# Patient Record
Sex: Female | Born: 1940 | Race: White | Hispanic: No | Smoking: Never smoker
Health system: Southern US, Community
[De-identification: ages and names within clinical notes are randomized; demographics above are authoritative.]

## PROBLEM LIST (undated history)

## (undated) DIAGNOSIS — F419 Anxiety disorder, unspecified: Secondary | ICD-10-CM

## (undated) DIAGNOSIS — M1990 Unspecified osteoarthritis, unspecified site: Secondary | ICD-10-CM

## (undated) DIAGNOSIS — I10 Essential (primary) hypertension: Secondary | ICD-10-CM

## (undated) DIAGNOSIS — K5792 Diverticulitis of intestine, part unspecified, without perforation or abscess without bleeding: Secondary | ICD-10-CM

## (undated) DIAGNOSIS — R519 Headache, unspecified: Secondary | ICD-10-CM

## (undated) DIAGNOSIS — R51 Headache: Secondary | ICD-10-CM

## (undated) HISTORY — PX: BUNIONECTOMY: SHX129

## (undated) HISTORY — PX: ABDOMINAL HYSTERECTOMY: SHX81

## (undated) HISTORY — DX: Diverticulitis of intestine, part unspecified, without perforation or abscess without bleeding: K57.92

## (undated) HISTORY — PX: HERNIA REPAIR: SHX51

## (undated) HISTORY — PX: OTHER SURGICAL HISTORY: SHX169

## (undated) HISTORY — PX: CARPAL TUNNEL RELEASE: SHX101

## (undated) HISTORY — PX: TONSILLECTOMY: SUR1361

---

## 1999-04-17 ENCOUNTER — Ambulatory Visit (HOSPITAL_COMMUNITY): Admission: RE | Admit: 1999-04-17 | Discharge: 1999-04-17 | Payer: Self-pay | Admitting: Family Medicine

## 1999-04-17 ENCOUNTER — Encounter: Payer: Self-pay | Admitting: Family Medicine

## 2000-06-27 ENCOUNTER — Encounter: Payer: Self-pay | Admitting: Obstetrics and Gynecology

## 2000-06-27 ENCOUNTER — Encounter: Admission: RE | Admit: 2000-06-27 | Discharge: 2000-06-27 | Payer: Self-pay | Admitting: Obstetrics and Gynecology

## 2000-06-29 ENCOUNTER — Other Ambulatory Visit: Admission: RE | Admit: 2000-06-29 | Discharge: 2000-06-29 | Payer: Self-pay | Admitting: Obstetrics and Gynecology

## 2000-11-07 ENCOUNTER — Encounter: Payer: Self-pay | Admitting: Emergency Medicine

## 2000-11-07 ENCOUNTER — Emergency Department (HOSPITAL_COMMUNITY): Admission: EM | Admit: 2000-11-07 | Discharge: 2000-11-07 | Payer: Self-pay | Admitting: Emergency Medicine

## 2000-11-09 ENCOUNTER — Ambulatory Visit (HOSPITAL_COMMUNITY): Admission: AD | Admit: 2000-11-09 | Discharge: 2000-11-09 | Payer: Self-pay | Admitting: Urology

## 2000-11-09 ENCOUNTER — Encounter: Payer: Self-pay | Admitting: Urology

## 2002-03-14 ENCOUNTER — Other Ambulatory Visit: Admission: RE | Admit: 2002-03-14 | Discharge: 2002-03-14 | Payer: Self-pay | Admitting: Gynecology

## 2002-06-21 ENCOUNTER — Encounter: Admission: RE | Admit: 2002-06-21 | Discharge: 2002-06-21 | Payer: Self-pay | Admitting: Gynecology

## 2002-06-21 ENCOUNTER — Encounter: Payer: Self-pay | Admitting: Gynecology

## 2003-01-02 ENCOUNTER — Encounter: Admission: RE | Admit: 2003-01-02 | Discharge: 2003-01-02 | Payer: Self-pay | Admitting: Family Medicine

## 2003-01-02 ENCOUNTER — Encounter: Payer: Self-pay | Admitting: Family Medicine

## 2003-12-20 ENCOUNTER — Ambulatory Visit (HOSPITAL_COMMUNITY): Admission: RE | Admit: 2003-12-20 | Discharge: 2003-12-20 | Payer: Self-pay | Admitting: Orthopedic Surgery

## 2003-12-20 ENCOUNTER — Ambulatory Visit (HOSPITAL_BASED_OUTPATIENT_CLINIC_OR_DEPARTMENT_OTHER): Admission: RE | Admit: 2003-12-20 | Discharge: 2003-12-20 | Payer: Self-pay | Admitting: Orthopedic Surgery

## 2012-01-02 ENCOUNTER — Other Ambulatory Visit: Payer: Self-pay | Admitting: Family Medicine

## 2012-01-03 NOTE — Telephone Encounter (Signed)
Rx sent in, [facility] notifying patient.

## 2012-01-03 NOTE — Telephone Encounter (Signed)
PT WANT TO MAKE SURE DR KURT WILL GIVE HER AT LEAST ONE MORE TIME BEFORE SHE COMES BACK IN TO SEE HIM THE MEDICINE IS IMITREX.PLEASE CALL PT AT [phone]     [facility] [address]

## 2012-01-30 ENCOUNTER — Other Ambulatory Visit: Payer: Self-pay | Admitting: Internal Medicine

## 2012-04-03 ENCOUNTER — Ambulatory Visit: Payer: Medicare Other

## 2012-04-04 ENCOUNTER — Ambulatory Visit (INDEPENDENT_AMBULATORY_CARE_PROVIDER_SITE_OTHER): Payer: Medicare Other | Admitting: Family Medicine

## 2012-04-04 VITALS — BP 130/85 | HR 87 | Temp 97.8°F | Resp 16 | Ht 63.5 in | Wt 174.0 lb

## 2012-04-04 DIAGNOSIS — M629 Disorder of muscle, unspecified: Secondary | ICD-10-CM

## 2012-04-04 DIAGNOSIS — G43909 Migraine, unspecified, not intractable, without status migrainosus: Secondary | ICD-10-CM

## 2012-04-04 DIAGNOSIS — M7630 Iliotibial band syndrome, unspecified leg: Secondary | ICD-10-CM

## 2012-04-04 MED ORDER — DICLOFENAC SODIUM 75 MG PO TBEC: DELAYED_RELEASE_TABLET | ORAL | Status: DC

## 2012-04-04 MED ORDER — SUMATRIPTAN SUCCINATE 6 MG/0.5ML ~~LOC~~ SOLN: SUBCUTANEOUS | Status: DC

## 2012-04-04 MED ORDER — SUMATRIPTAN SUCCINATE 100 MG PO TABS: 100.0000 mg | ORAL_TABLET | ORAL | Status: DC | PRN

## 2012-04-04 NOTE — Progress Notes (Signed)
71 yo woman with migraine. She needs a refill of the injectable and the tablets because she travels.  The last generic imitrex injection, given into the thigh, was very painful.  Gets migraines usually when she awakens, several times a month  She also has a problem with her thigh.  Her right lateral thigh has been painful for months, beginning near lateral hip.  No new exercise or trauma.  The hip is better but the lateral thigh persists with tenderness to percussion over lateral knee joint line.    Objective:  NAD Alert, appropriate with normal cranial nerves and motor function. Full hip ROM Mildly tender lateral right thigh and right lateral knee joint line.  Assessment: migraine Iliotibial band syndrome  Plan: 1. Iliotibial band syndrome  diclofenac (VOLTAREN) 75 MG EC tablet  2. Migraine  SUMAtriptan (IMITREX) 100 MG tablet, SUMAtriptan (IMITREX STATDOSE REFILL) 6 MG/0.5ML SOLN injection   Note:  I spent 30 minutes face to face with patient

## 2012-04-04 NOTE — Patient Instructions (Signed)
Iliotibial Band Syndrome Iliotibial band syndrome is pain in the outer, upper thigh. The pain is due to an inflammation (soreness) of the iliotibial band. This is a band of thick fibrous tissue that runs down the outside of the thigh. The iliotibial band begins at the hip. It extends to the outer side of the shin bone (tibia) just below the knee joint. The band works with the thigh muscles. Together they provide stability to the outside of the knee joint. Iliotibial band syndrome (ITBS) occurs when there is inflammation to this band of tissue. The irritation usually occurs over the outside of the knee joint, at the lateral epicondyle (the end of the femur bone.) The iliotibial band crosses bone and muscle at this point. Between these structures is a bursa (a cushioning sac). The bursa should make possible a smooth gliding motion. However, when inflamed, the iliotibial band does not glide easily. When inflamed, there is pain with motion of the knee. Usually the pain worsens with continued movement and the pain goes away with rest. This problem usually arises when there is a sudden increase in sports activities involving the lower extremities (your legs). Running, and playing soccer or basketball are examples of activities causing this. Others who are prone to ITBS include individuals with mechanical problems such as leg length differences, abnormality of walking, bowed legs etc. This diagnosis (learning what is wrong) is made by examination. X-rays are usually normal if only soft tissue inflammation is present. Treatment of ITBS begins with proper footwear, icing the area of pain, stretching, and resting for a period of time. Incorporating low-impact cross-training activities may also help. Your caregiver may prescribe anti-inflammatory medications as well. HOME CARE INSTRUCTIONS   Apply ice to the sore area for 15 to 20 minutes, 3 to 4 times per day. Put the ice in a plastic bag and place a towel between the  bag of ice and your skin.   Limit excessive training or eliminate training until pain goes away.   While pain is present, you may use gentle range of motion. Do not resume regular use until instructed by your caregiver. Begin use gradually. Do not increase use to the point of pain. If pain does develop, decrease use and continue the above measures. Gradually increase activities that do not cause discomfort. Do this until you finally achieve normal use.   Perform low impact activities while pain is present. Wear proper footwear.   Only take over-the-counter or prescription medicines for pain, discomfort, or fever as directed by your caregiver.  SEEK MEDICAL CARE IF: 1  Your pain increases or pain is not controlled with medications.   You develop new, unexplained symptoms (problems), or an increase of the symptoms that brought you to your caregiver.   Your pain and symptoms are not improving or are getting worse.  Document Released: 02/12/2002 Document Revised: 08/12/2011 Document Reviewed: 08/23/2005 [facility] Patient Information 2012 [place], [place].

## 2012-06-22 ENCOUNTER — Other Ambulatory Visit: Payer: Self-pay | Admitting: Family Medicine

## 2012-06-22 ENCOUNTER — Telehealth: Payer: Self-pay

## 2012-06-22 NOTE — Telephone Encounter (Signed)
Previous message not completed: discussed w/pt that NSAIDS normally cause fewer SEs for pts than prednisone and she decided to try the Rx for voltaren that was Rxd at [facility] and CB if she has any problems w/it.

## 2012-06-22 NOTE — Telephone Encounter (Signed)
PT REQUESTS PREDNISONE RX. PT DID NOT FILL RX GIVEN TO HER BY DR Milus Glazier ABOUT 2 MONTHS AGO DECIDED DID NOT WANT TO TAKE IT     PT PH [phone] [facility] [address]

## 2012-06-22 NOTE — Telephone Encounter (Signed)
Pt reported that Dr L did not write the Rx for prednisone when she was in the end of July for her hip pain because she was hesitant to take it and wrote Rx for Voltaren instead. Pt did not realize that Voltaren is an NSAID and she didn't end up taking it bc she didn't want to take an NSAID. I asked pt if she has ever had a GI problem or other that she was instructed to not take an NSAID. Pt stated that she has never been instr'd by MD to not take an NSAID. Pt stated that her hip improved for a while, but still bothers her sometimes more than others and she is leaving Sunday for a week and will be doing a lot of walking. D/W pt that

## 2012-08-31 DIAGNOSIS — G43909 Migraine, unspecified, not intractable, without status migrainosus: Secondary | ICD-10-CM | POA: Insufficient documentation

## 2012-10-26 ENCOUNTER — Telehealth: Payer: Self-pay

## 2012-10-26 DIAGNOSIS — G43909 Migraine, unspecified, not intractable, without status migrainosus: Secondary | ICD-10-CM

## 2012-10-26 MED ORDER — SUMATRIPTAN SUCCINATE 100 MG PO TABS: 100.0000 mg | ORAL_TABLET | ORAL | Status: DC | PRN

## 2012-10-26 NOTE — Telephone Encounter (Signed)
I have spoken to patient she is requesting Rx for Imitrex tablet. She has Rx for injections, states pen for injection is too painful. She used tablets, and has used them all, would like refill for higher quantity. Would like 27 tablets of this. Please advise.

## 2012-10-26 NOTE — Telephone Encounter (Signed)
Spoke with patient and she said Dr. Milus Glazier told her to f/u in one year so she is planning on following up in July.

## 2012-10-26 NOTE — Telephone Encounter (Signed)
PT STATES SHE HAD CALLED FOR DR KURT LAST WEEK BEFORE HE LEFT OUT OF THE COUNTRY AND STILL HASN'T HEARD FROM ANYONE. REALLY WOULD LIKE A CALL BACK ASAP PLEASE CALL HER HOME FIRST AT [phone] AND LEAVE A MESSAGE. ALSO YOU MAY CALL HER AT [phone] ON HER CELL. IS REALLY ANXIOUS TO SPEAK WITH SOMEONE

## 2012-10-26 NOTE — Telephone Encounter (Signed)
I have sent imitrex to the pharmacy.  Needs follow-up with Dr. Elbert Ewings.

## 2012-11-20 ENCOUNTER — Other Ambulatory Visit: Payer: Self-pay | Admitting: Physician Assistant

## 2013-02-24 ENCOUNTER — Other Ambulatory Visit: Payer: Self-pay | Admitting: Physician Assistant

## 2013-02-25 ENCOUNTER — Telehealth: Payer: Self-pay

## 2013-02-25 ENCOUNTER — Other Ambulatory Visit: Payer: Self-pay | Admitting: Physician Assistant

## 2013-02-25 NOTE — Telephone Encounter (Signed)
Patient needs a refill on sumatriptan quantity of 30. [facility] and [facility] [place] twice about the refill. Best #: [phone]

## 2013-02-25 NOTE — Telephone Encounter (Signed)
Dr. Katrinka Blazing, I am sending this to you as fyi because she wanted me to let someone know this is what is going on.  Spoke with patient and she said that she has been trying to get this since Thursday. Pharmacy has faxed [place] at least 4 times if not more. She has called a couple times as well. Told her RX was e-scribed on the 21st. Not sure why they did not get it but I would call it in. She said this is not the first time this has happened and if it were her practice she would want to find out what the problem is and fix it because this happens a lot. She was also notified that she would need OV for refill. She voiced understanding. Called [facility] and gave verbal.

## 2013-02-27 ENCOUNTER — Other Ambulatory Visit: Payer: Self-pay | Admitting: Radiology

## 2013-02-27 ENCOUNTER — Telehealth: Payer: Self-pay

## 2013-03-02 ENCOUNTER — Telehealth: Payer: Self-pay | Admitting: Physician Assistant

## 2013-03-02 NOTE — Telephone Encounter (Signed)
I called to speak to patient regarding her concerns and frustrations regards her recent need for her Imitrex injection. Pt stated that her problem was taken care yesterday but then she proceed to raise her voice and get angry about the situation again which seemed to be partially our fault and partially the pharmacy's fault.  I tried to apologize and focus on the fact that she was able to get her medications but she just wanted to complain about what had happened.  She was rude to me (out of frustration she told me) and I tried multiple times to get off the phone with her because I felt she was just venting to me about her situation - her plan is to f/u with Dr Milus Glazier in July.

## 2013-12-13 ENCOUNTER — Ambulatory Visit (INDEPENDENT_AMBULATORY_CARE_PROVIDER_SITE_OTHER): Payer: Medicare Other | Admitting: Family Medicine

## 2013-12-13 VITALS — BP 142/88 | HR 77 | Temp 97.8°F | Resp 16 | Ht 63.0 in | Wt 159.4 lb

## 2013-12-13 DIAGNOSIS — E785 Hyperlipidemia, unspecified: Secondary | ICD-10-CM

## 2013-12-13 DIAGNOSIS — R51 Headache: Secondary | ICD-10-CM

## 2013-12-13 DIAGNOSIS — M1610 Unilateral primary osteoarthritis, unspecified hip: Secondary | ICD-10-CM

## 2013-12-13 DIAGNOSIS — R519 Headache, unspecified: Secondary | ICD-10-CM

## 2013-12-13 MED ORDER — SUMATRIPTAN SUCCINATE 100 MG PO TABS: 100.0000 mg | ORAL_TABLET | ORAL | Status: DC | PRN

## 2013-12-13 MED ORDER — SUMATRIPTAN SUCCINATE 100 MG PO TABS: ORAL_TABLET | ORAL | Status: DC

## 2013-12-13 MED ORDER — METHYLPREDNISOLONE ACETATE 80 MG/ML IJ SUSP
120.0000 mg | Freq: Once | INTRAMUSCULAR | Status: AC
  Administered 2013-12-13: 120 mg via INTRAMUSCULAR

## 2013-12-13 NOTE — Progress Notes (Signed)
This chart was scribed for Melinda SidleKurt Lauenstein, MD by Nicholos Johnsenise Iheanachor, Medical Scribe. This patient's care was started at 8:27 PM.  Patient ID: Melinda SawyersRosemarie W Washington MRN: [id number], DOB: 01/19/1941, 73 y.o. Date of Encounter: 12/13/2013, 8:27 PM  Primary Physician: No primary provider on file.  Chief Complaint: medication refill, left hip pain   HPI: 73 y.o. year old female with history below presents for multiple complaints. Needs Imitrex refilled. Also reports pain in left hip. States she has been limping for years with pain and now had no cartilage left in the hips. Pt is noticeably limping today. Was diagnosed with IT Band syndrome years ago and states that resolved for the most part. Now pain is at the tip of the hip. Reports pain with every step; says it is very "inhabiting." Says she overcompensates when walking to avoid hip pain. Would like a cortisone shot for some pain relief. [facility] and received a heat treatment that she states helped with pain.   Also reports some arthritis in her hands that has gradually worsened over the last week.   Patient has been advised to proceed with surgery for trigger finger and right hand in the past but declined. She has had carpal tunnel surgery and right which was successful. She now has some numbness of the left hand and carpal tunnel but up to this point has declined surgery by Dr.Sypher. She may want to reconsider this when she gets back from [place]   No past medical history on file.   Home Meds: Prior to Admission medications   Medication Sig Start Date End Date Taking? Authorizing Provider  SUMAtriptan (IMITREX) 100 MG tablet Take 1 tablet (100 mg total) by mouth every 2 (two) hours as needed for migraine. Max 2 doses in 24 hours. Need office visit for additional refills. 02/24/13  Yes Chelle Tessa LernerS Jeffery, PA-C    Allergies:  Allergies  Allergen Reactions   Penicillins     History   Social History   Marital  Status: Single    Spouse Name: N/A    Number of Children: N/A   Years of Education: N/A   Occupational History   Not on file.   Social History Main Topics   Smoking status: Never Smoker    Smokeless tobacco: Not on file   Alcohol Use: Not on file   Drug Use: Not on file   Sexual Activity: Not on file   Other Topics Concern   Not on file   Social History Narrative   No narrative on file     Review of Systems: Constitutional: negative for chills, fever, night sweats, weight changes, or fatigue  HEENT: negative for vision changes, hearing loss, congestion, rhinorrhea, ST, epistaxis, or sinus pressure Cardiovascular: negative for chest pain or palpitations Respiratory: negative for hemoptysis, wheezing, shortness of breath, or cough Abdominal: negative for abdominal pain, nausea, vomiting, diarrhea, or constipation Dermatological: negative for rash Neurologic: negative for headache, dizziness, or syncope All other systems reviewed and are otherwise negative with the exception to those above and in the HPI.   Physical Exam: Blood pressure 142/88, pulse 77, temperature 97.8 F (36.6 C), temperature source Oral, resp. rate 16, height 5\' 3"  (1.6 m), weight 159 lb 6.4 oz (72.303 kg), last menstrual period 04/04/2012, SpO2 97.00%., Body mass index is 28.24 kg/(m^2). General: Well developed, well nourished, in no acute distress. Head: Normocephalic, atraumatic, eyes without discharge, sclera non-icteric, nares are without discharge. Bilateral auditory canals clear, TM's  are without perforation, pearly grey and translucent with reflective cone of light bilaterally. Oral cavity moist, posterior pharynx without exudate, erythema, peritonsillar abscess, or post nasal drip.  Neck: Supple. No thyromegaly. Full ROM. No lymphadenopathy. Lungs: Clear bilaterally to auscultation without wheezes, rales, or rhonchi. Breathing is unlabored. Heart: RRR with S1 S2. No murmurs, rubs, or gallops  appreciated. Abdomen: Soft, non-tender, non-distended with normoactive bowel sounds. No hepatomegaly. No rebound/guarding. No obvious abdominal masses. Msk:  Strength and tone normal for age. Extremities/Skin: Warm and dry. No clubbing or cyanosis. No edema. No rashes or suspicious lesions. Neuro: Alert and oriented X 3. Moves all extremities spontaneously. Gait is normal. CNII-XII grossly in tact. Psych:  Responds to questions appropriately with a normal affect.   Patient has full range of motion of her left hip. Rotating the leg when extended back and forth results in no pain.  When her left hip is flexed at the hip, I rotated internally And externally with pain only on extreme internal rotation  There is no trochanteric pain.  Examination of the extremities reveals marked Heberden's nodes all fingers with deformity and she does have some slight swelling of the MCP joint of the right hand. She has incomplete flexion of all fingers on the right with stiffness as well.  ASSESSMENT AND PLAN:  73 y.o. year old female with Headache - Plan: Lipid panel, Rheumatoid factor, methylPREDNISolone acetate (DEPO-MEDROL) injection 120 mg, Sedimentation rate, DISCONTINUED: SUMAtriptan (IMITREX) 100 MG tablet, CANCELED: POCT SEDIMENTATION RATE  Arthritis, hip - Plan: Lipid panel, Rheumatoid factor, methylPREDNISolone acetate (DEPO-MEDROL) injection 120 mg, Sedimentation rate, DISCONTINUED: SUMAtriptan (IMITREX) 100 MG tablet, CANCELED: POCT SEDIMENTATION RATE     Signed, Melinda Sidle, MD 12/13/2013 8:27 PM

## 2013-12-14 LAB — LIPID PANEL
Cholesterol: 214 mg/dL — ABNORMAL HIGH (ref 0–200)
HDL: 58 mg/dL (ref >39)
LDL Cholesterol: 122 mg/dL — ABNORMAL HIGH (ref 0–99)
Total CHOL/HDL Ratio: 3.7 Ratio
Triglycerides: 168 mg/dL — ABNORMAL HIGH (ref <150)
VLDL: 34 mg/dL (ref 0–40)

## 2013-12-14 LAB — SEDIMENTATION RATE: Sed Rate: 15 mm/hr (ref 0–22)

## 2013-12-14 LAB — RHEUMATOID FACTOR: Rhuematoid fact SerPl-aCnc: <10 IU/mL (ref <=14)

## 2013-12-17 ENCOUNTER — Telehealth: Payer: Self-pay

## 2013-12-17 NOTE — Telephone Encounter (Signed)
Pt is not doing any better from the cortisone shot. Today the pain has been the worst. Please advise. Pt is going out of town in 2 weeks for a month and wants to know what we can do Pt notified of labs

## 2013-12-17 NOTE — Telephone Encounter (Signed)
Pt states she has no relief from prednisone injection given on Thursday of last week,needs advice, She also wants breakdown of her cholesterol numbers and the numbers from previous chol testing  She request that you call her home number [phone] first and leave a message and then result to her cell phone [phone]

## 2013-12-18 ENCOUNTER — Encounter: Payer: Self-pay | Admitting: Family Medicine

## 2013-12-18 ENCOUNTER — Other Ambulatory Visit: Payer: Self-pay | Admitting: Family Medicine

## 2013-12-18 DIAGNOSIS — M1990 Unspecified osteoarthritis, unspecified site: Secondary | ICD-10-CM

## 2014-01-15 ENCOUNTER — Telehealth: Payer: Self-pay

## 2014-01-15 NOTE — Telephone Encounter (Signed)
Is there any other codes that we can use for this pt's Lipid panel? Headache and hip pain won't it. Thanks

## 2014-01-16 NOTE — Telephone Encounter (Signed)
Patient has a history of hyperlipidemia

## 2014-04-05 ENCOUNTER — Telehealth: Payer: Self-pay | Admitting: *Deleted

## 2014-04-05 NOTE — Telephone Encounter (Signed)
LM for pt with [facility] number [facility]. Advised in message that pt would need an OV if a referral was needed due to documenting for the referral/insurance.

## 2014-04-05 NOTE — Telephone Encounter (Signed)
Pt wanted to know if Dr. Milus GlazierLauenstein would recommended a good Ortho Dr for a hip replacement.   Pt is checking w/insurance to see if she has to have a referral or not.  Call Pt @  [phone] [facility](Home) if no answer please call [phone] (Cell)

## 2014-11-14 ENCOUNTER — Telehealth: Payer: Self-pay

## 2014-11-14 NOTE — Telephone Encounter (Signed)
Dr. Milus GlazierLauenstein can you help me appeal this tomorrow when you come. Pt states 9 pills will not be enough for the month. I may need your help.

## 2014-11-14 NOTE — Telephone Encounter (Signed)
Patient says her insurance says they made an exception for her last refill of migraine medication and allowed her to get 30 tablets. From now on they will only allow 9 pills unless her doctors office calls ahead of time.   [facility] [phone]

## 2014-12-31 DIAGNOSIS — M1612 Unilateral primary osteoarthritis, left hip: Secondary | ICD-10-CM | POA: Diagnosis not present

## 2015-02-20 DIAGNOSIS — Z880 Allergy status to penicillin: Secondary | ICD-10-CM | POA: Diagnosis not present

## 2015-02-20 DIAGNOSIS — Z01818 Encounter for other preprocedural examination: Secondary | ICD-10-CM | POA: Diagnosis not present

## 2015-02-20 DIAGNOSIS — M1612 Unilateral primary osteoarthritis, left hip: Secondary | ICD-10-CM | POA: Diagnosis not present

## 2015-02-24 ENCOUNTER — Ambulatory Visit (INDEPENDENT_AMBULATORY_CARE_PROVIDER_SITE_OTHER): Payer: Medicare Other | Admitting: Family Medicine

## 2015-02-24 VITALS — BP 119/84 | HR 87 | Temp 99.3°F | Ht 63.0 in | Wt 145.4 lb

## 2015-02-24 DIAGNOSIS — M1990 Unspecified osteoarthritis, unspecified site: Secondary | ICD-10-CM

## 2015-02-24 DIAGNOSIS — G43009 Migraine without aura, not intractable, without status migrainosus: Secondary | ICD-10-CM | POA: Diagnosis not present

## 2015-02-24 DIAGNOSIS — M1612 Unilateral primary osteoarthritis, left hip: Secondary | ICD-10-CM

## 2015-02-24 MED ORDER — SUMATRIPTAN SUCCINATE 100 MG PO TABS: ORAL_TABLET | ORAL | Status: DC

## 2015-02-24 NOTE — Patient Instructions (Signed)
The name of the orthopedic surgeon in [place] is Glorious Peach   Migraine Headache A migraine headache is an intense, throbbing pain on one or both sides of your head. A migraine can last for 30 minutes to several hours. CAUSES  The exact cause of a migraine headache is not always known. However, a migraine may be caused when nerves in the brain become irritated and release chemicals that cause inflammation. This causes pain. Certain things may also trigger migraines, such as:  Alcohol.  Smoking.  Stress.  Menstruation.  Aged cheeses.  Foods or drinks that contain nitrates, glutamate, aspartame, or tyramine.  Lack of sleep.  Chocolate.  Caffeine.  Hunger.  Physical exertion.  Fatigue.  Medicines used to treat chest pain (nitroglycerine), birth control pills, estrogen, and some blood pressure medicines. SIGNS AND SYMPTOMS  Pain on one or both sides of your head.  Pulsating or throbbing pain.  Severe pain that prevents daily activities.  Pain that is aggravated by any physical activity.  Nausea, vomiting, or both.  Dizziness.  Pain with exposure to bright lights, loud noises, or activity.  General sensitivity to bright lights, loud noises, or smells. Before you get a migraine, you may get warning signs that a migraine is coming (aura). An aura may include:  Seeing flashing lights.  Seeing bright spots, halos, or zigzag lines.  Having tunnel vision or blurred vision.  Having feelings of numbness or tingling.  Having trouble talking.  Having muscle weakness. DIAGNOSIS  A migraine headache is often diagnosed based on:  Symptoms.  Physical exam.  A CT scan or MRI of your head. These imaging tests cannot diagnose migraines, but they can help rule out other causes of headaches. TREATMENT Medicines may be given for pain and nausea. Medicines can also be given to help prevent recurrent migraines.  HOME CARE INSTRUCTIONS  Only take  over-the-counter or prescription medicines for pain or discomfort as directed by your health care provider. The use of long-term narcotics is not recommended.  Lie down in a dark, quiet room when you have a migraine.  Keep a journal to find out what may trigger your migraine headaches. For example, write down:  What you eat and drink.  How much sleep you get.  Any change to your diet or medicines.  Limit alcohol consumption.  Quit smoking if you smoke.  Get 7-9 hours of sleep, or as recommended by your health care provider.  Limit stress.  Keep lights dim if bright lights bother you and make your migraines worse. SEEK IMMEDIATE MEDICAL CARE IF:   Your migraine becomes severe.  You have a fever.  You have a stiff neck.  You have vision loss.  You have muscular weakness or loss of muscle control.  You start losing your balance or have trouble walking.  You feel faint or pass out.  You have severe symptoms that are different from your first symptoms. MAKE SURE YOU:   Understand these instructions.  Will watch your condition.  Will get help right away if you are not doing well or get worse. Document Released: 08/23/2005 Document Revised: 01/07/2014 Document Reviewed: 04/30/2013 [facility] Patient Information 2015 [place], [place]. This information is not intended to replace advice given to you by your health care provider. Make sure you discuss any questions you have with your health care provider.

## 2015-02-24 NOTE — Progress Notes (Signed)
Patient ID: Melinda Washington, female   DOB: 01-02-1941, 74 y.o.   MRN: [id number]   This chart was scribed for Elvina Sidle, MD by [facility], medical scribe at [facility].The patient was seen in exam room 08 and the patient's care was started at 6:45 PM.  Patient ID: Melinda Washington MRN: [id number], DOB: [birth year], 74 y.o. Date of Encounter: 02/24/2015  Primary Physician: No primary care provider on file.  Chief Complaint:  Chief Complaint  Patient presents with   Medication Refill    Needs Imitrex refilled   HPI:  Melinda Washington is a 74 y.o. female who presents to [facility] for a medication refill.   Migraines: Started taking Magnesium Citrate two years ago which reduced her Migraines from 20 to 5 a month but this only lasted for 5 months. She is still taking the Magnesium Citrate but she needs to restart her Imitrex.   Left hip pain: She complains of left hip pain for over a year. She has had x-rays done and will bring them in. She has made contact for hip surgery. Recommended to see Dr. Lamar Sprinkles at [facility], she wants to see Dr. Lequita Halt. She could do the surgery in [place] but her insurance will not cover it unless I speak with them.  Brother passed away last year.  No past medical history on file.   Home Meds: Prior to Admission medications   Medication Sig Start Date End Date Taking? Authorizing Provider  SUMAtriptan (IMITREX) 100 MG tablet May take one tablet on the onset of headache. Then repeat in 2 hours as needed. Maximum 2 doses in 24 hours 12/13/13  Yes Elvina Sidle, MD   Allergies:  Allergies  Allergen Reactions   Latex Other (See Comments)    Redness & burning   Penicillins    History   Social History   Marital Status: Single    Spouse Name: N/A   Number of Children: N/A   Years of Education: N/A   Occupational History   Not on file.   Social History Main Topics   Smoking status:  Never Smoker    Smokeless tobacco: Not on file   Alcohol Use: Not on file   Drug Use: Not on file   Sexual Activity: Not on file   Other Topics Concern   Not on file   Social History Narrative    Review of Systems: Constitutional: negative for chills, fever, night sweats, weight changes, or fatigue  HEENT: negative for vision changes, hearing loss, congestion, rhinorrhea, ST, epistaxis, or sinus pressure Cardiovascular: negative for chest pain or palpitations Respiratory: negative for hemoptysis, wheezing, shortness of breath, or cough Abdominal: negative for abdominal pain, nausea, vomiting, diarrhea, or constipation Dermatological: negative for rash Msk: Positive for arthralgias  Neurologic: negative for dizziness, or syncope. Positive for migraines. All other systems reviewed and are otherwise negative with the exception to those above and in the HPI.  Physical Exam: Blood pressure 119/84, pulse 87, temperature 99.3 F (37.4 C), temperature source Oral, height 5\' 3"  (1.6 m), weight 145 lb 6 oz (65.942 kg), last menstrual period 04/04/2012, SpO2 98 %., Body mass index is 25.76 kg/(m^2). General: Well developed, well nourished, in no acute distress. Head: Normocephalic, atraumatic, eyes without discharge, sclera non-icteric, nares are without discharge. Bilateral auditory canals clear, TM's are without perforation, pearly grey and translucent with reflective cone of light bilaterally. Oral cavity moist, posterior pharynx without exudate, erythema, peritonsillar  abscess, or post nasal drip.  Neck: Supple. No thyromegaly. Full ROM. No lymphadenopathy. Lungs: Clear bilaterally to auscultation without wheezes, rales, or rhonchi. Breathing is unlabored. Heart: RRR with S1 S2. No murmurs, rubs, or gallops appreciated. Abdomen: Soft, non-tender, non-distended with normoactive bowel sounds. No hepatomegaly. No rebound/guarding. No obvious abdominal masses. Msk:  Strength and tone normal  for age. Extremities/Skin: Warm and dry. No clubbing or cyanosis. No edema. No rashes or suspicious lesions. Neuro: Alert and oriented X 3. Moves all extremities spontaneously. Gait is normal. CNII-XII grossly in tact. Psych:  Responds to questions appropriately with a normal affect.  I spent over 30 minutes face-to-face discussing alternatives surgical procedures for the hip as well as alternative medical approaches to the migraines. Patient will consider resurfacing procedure for the hip, neurology referral for the migraines, and consideration of zonisamide or Topamax for the latter as well. Labs: Results for orders placed or performed in visit on 12/13/13  Lipid panel  Result Value Ref Range   Cholesterol 214 (H) 0 - 200 mg/dL   Triglycerides 454 (H) <150 mg/dL   HDL 58 >09 mg/dL   Total CHOL/HDL Ratio 3.7 Ratio   VLDL 34 0 - 40 mg/dL   LDL Cholesterol 811 (H) 0 - 99 mg/dL  Rheumatoid factor  Result Value Ref Range   Rhuematoid fact SerPl-aCnc <10 <=14 IU/mL  Sedimentation rate  Result Value Ref Range   Sed Rate 15 0 - 22 mm/hr   BP Readings from Last 3 Encounters:  02/24/15 119/84  12/13/13 142/88  04/04/12 130/85   ASSESSMENT AND PLAN:  74 y.o. year old female with   This chart was scribed in my presence and reviewed by me personally.    ICD-9-CM ICD-10-CM   1. Migraine without aura and without status migrainosus, not intractable 346.10 G43.009 SUMAtriptan (IMITREX) 100 MG tablet  2. Arthritis of left hip 716.95 M19.90 Ambulatory referral to Orthopedic Surgery    Signed, Elvina Sidle, MD 02/24/2015 7:06 PM

## 2015-02-25 ENCOUNTER — Telehealth: Payer: Self-pay

## 2015-02-25 DIAGNOSIS — G43009 Migraine without aura, not intractable, without status migrainosus: Secondary | ICD-10-CM

## 2015-02-25 MED ORDER — SUMATRIPTAN SUCCINATE 100 MG PO TABS: ORAL_TABLET | ORAL | Status: DC

## 2015-02-25 MED ORDER — SUMATRIPTAN SUCCINATE 100 MG PO TABS
ORAL_TABLET | ORAL | Status: DC
Start: 2015-02-25 — End: 2015-02-25

## 2015-02-25 NOTE — Telephone Encounter (Signed)
Sent in #9 to her pharmacy.. Called pt to let her know. She states she did not want 9 pills sent in, she needs a letter for her insurance company to pay for #30. I remember speaking to you about this pt and this situation with her Imitrex. You stated that it was dangerous for pt to have that many per month and I explained this to her then. She states you agreed to write a letter to appeal this. Please write letter so I can submit this to the insurance company.

## 2015-02-25 NOTE — Telephone Encounter (Signed)
Pt was seen here last night and was prescribed SUMAtriptan (IMITREX) 100 MG tablet [[id number]] 30 pills. Her insurance company will only pay for 9 tablets at a time. Please advise at [phone]

## 2015-02-26 ENCOUNTER — Encounter: Payer: Self-pay | Admitting: Family Medicine

## 2015-04-02 DIAGNOSIS — Z01812 Encounter for preprocedural laboratory examination: Secondary | ICD-10-CM | POA: Diagnosis not present

## 2015-04-02 DIAGNOSIS — R9431 Abnormal electrocardiogram [ECG] [EKG]: Secondary | ICD-10-CM | POA: Diagnosis not present

## 2015-04-02 DIAGNOSIS — M1612 Unilateral primary osteoarthritis, left hip: Secondary | ICD-10-CM | POA: Diagnosis not present

## 2015-04-10 DIAGNOSIS — M1612 Unilateral primary osteoarthritis, left hip: Secondary | ICD-10-CM | POA: Diagnosis not present

## 2015-04-28 ENCOUNTER — Ambulatory Visit (INDEPENDENT_AMBULATORY_CARE_PROVIDER_SITE_OTHER): Payer: Medicare Other | Admitting: Family Medicine

## 2015-04-28 VITALS — BP 150/100 | HR 103 | Temp 98.9°F | Resp 18 | Ht 63.0 in | Wt 145.4 lb

## 2015-04-28 DIAGNOSIS — R1030 Lower abdominal pain, unspecified: Secondary | ICD-10-CM

## 2015-04-28 DIAGNOSIS — K5732 Diverticulitis of large intestine without perforation or abscess without bleeding: Secondary | ICD-10-CM | POA: Diagnosis not present

## 2015-04-28 DIAGNOSIS — M1612 Unilateral primary osteoarthritis, left hip: Secondary | ICD-10-CM

## 2015-04-28 LAB — POCT CBC
Granulocyte percent: 75.7 %G (ref 37–80)
HCT, POC: 36 % — AB (ref 37.7–47.9)
Hemoglobin: 11.7 g/dL — AB (ref 12.2–16.2)
Lymph, poc: 1.2 (ref 0.6–3.4)
MCH, POC: 28.6 pg (ref 27–31.2)
MCHC: 32.6 g/dL (ref 31.8–35.4)
MCV: 87.6 fL (ref 80–97)
MID (CBC): 0.5 (ref 0–0.9)
MPV: 7.4 fL (ref 0–99.8)
POC Granulocyte: 5.5 (ref 2–6.9)
POC LYMPH PERCENT: 16.7 %L (ref 10–50)
POC MID %: 7.6 %M (ref 0–12)
Platelet Count, POC: 294 10*3/uL (ref 142–424)
RBC: 4.11 M/uL (ref 4.04–5.48)
RDW, POC: 15.7 %
WBC: 7.2 10*3/uL (ref 4.6–10.2)

## 2015-04-28 LAB — POCT URINALYSIS DIPSTICK
Bilirubin, UA: NEGATIVE
Blood, UA: NEGATIVE
GLUCOSE UA: NEGATIVE
KETONES UA: NEGATIVE
Nitrite, UA: NEGATIVE
Protein, UA: NEGATIVE
SPEC GRAV UA: 1.01
Urobilinogen, UA: 0.2
pH, UA: 6.5

## 2015-04-28 LAB — POCT UA - MICROSCOPIC ONLY
BACTERIA, U MICROSCOPIC: NEGATIVE
CRYSTALS, UR, HPF, POC: NEGATIVE
Casts, Ur, LPF, POC: NEGATIVE
MUCUS UA: NEGATIVE
Yeast, UA: NEGATIVE

## 2015-04-28 MED ORDER — METRONIDAZOLE 500 MG PO TABS: 500.0000 mg | ORAL_TABLET | Freq: Two times a day (BID) | ORAL | Status: DC

## 2015-04-28 MED ORDER — SULFAMETHOXAZOLE-TRIMETHOPRIM 800-160 MG PO TABS: 1.0000 | ORAL_TABLET | Freq: Two times a day (BID) | ORAL | Status: DC

## 2015-04-28 NOTE — Patient Instructions (Signed)
Drink plenty of fluids  Try to keep the bowels moving regularly using MiraLAX to his  In the event of more severe abdominal pain, fever, chills, or generally getting worse please return at any time or go to the emergency room if necessary  Although not a definite diverticulitis, with your history of diverticulitis and the lower abdominal pain I believe we are obligated to treat you and to have you defer your hip surgery for a later date. You cannot risk getting a replaced joint infected.  With some leukocytes in your urine (white blood cells), although not enough to represent an infection, a urine culture is being checked to be absolutely certain since you are going to be having the surgery sometime in the near future.

## 2015-04-28 NOTE — Progress Notes (Signed)
Subjective:  Patient ID: Melinda Washington, female    DOB: March 02, 1941  Age: 74 y.o. MRN: [id number]  74 year old lady who comes in here with a history of having low abdominal pain for the last 3 days. She has a history of diverticulitis. She has had last treatment for that about 4 years ago. She has never had a colonoscopy, but some years ago she did have a barium enema. She did have a surgical procedure on her abdomen in the past which had to preop and adherent bowel. Cause of that is not clear. She has not tolerated some of the oral anabiotic's in the past, she believes it was the Cipro that is given her troubles. She is only continue to a few days of the anabiotic's and then started treating herself with drinking salt water in the mornings. That seemed to give her relief. She also has been taking some magnesium citrate on regular basis to keep her bowels moving well. She is anxious because this has been scheduled for some time, her surgeon will be moving soon, and she is fearful that is she does not get it done at this time she'll not be able get surgery done by him and will have to start all over with someone else.  She states her usual temperature runs 97 7, however in the old record she has some readings in the upper 97 and in the 98.6 range and on higher. Today her temperature when she came in was normal at 98.9.  She has not had any nausea or vomiting. Her bowels move regularly.   Objective:   Pleasant lady with some obvious pain in her left hip when stress to move around. Her chest is clear. Heart regular. Abdomen has normal active bowel sounds. Soft without organomegaly or masses but has tenderness across the lower abdomen both in midline and to the right and left of midline no rebound.    Assessment & Plan:   Assessment:  Low abdominal pain with low-grade fevers History of diverticulitis DJD of left hip, scheduled for surgery for tomorrow at [facility]  Plan:  Check urinalysis  and CBC  Results for orders placed or performed in visit on 04/28/15  POCT CBC  Result Value Ref Range   WBC 7.2 4.6 - 10.2 K/uL   Lymph, poc 1.2 0.6 - 3.4   POC LYMPH PERCENT 16.7 10 - 50 %L   MID (cbc) 0.5 0 - 0.9   POC MID % 7.6 0 - 12 %M   POC Granulocyte 5.5 2 - 6.9   Granulocyte percent 75.7 37 - 80 %G   RBC 4.11 4.04 - 5.48 M/uL   Hemoglobin 11.7 (A) 12.2 - 16.2 g/dL   HCT, POC 40.9 (A) 81.1 - 47.9 %   MCV 87.6 80 - 97 fL   MCH, POC 28.6 27 - 31.2 pg   MCHC 32.6 31.8 - 35.4 g/dL   RDW, POC 91.4 %   Platelet Count, POC 294 142 - 424 K/uL   MPV 7.4 0 - 99.8 fL  POCT UA - Microscopic Only  Result Value Ref Range   WBC, Ur, HPF, POC 0-4    RBC, urine, microscopic 0-1    Bacteria, U Microscopic neg    Mucus, UA neg    Epithelial cells, urine per micros 0-3    Crystals, Ur, HPF, POC neg    Casts, Ur, LPF, POC neg    Yeast, UA neg   POCT urinalysis dipstick  Result Value Ref  Range   Color, UA yellow    Clarity, UA clear    Glucose, UA neg    Bilirubin, UA neg    Ketones, UA neg    Spec Grav, UA 1.010    Blood, UA neg    pH, UA 6.5    Protein, UA neg    Urobilinogen, UA 0.2    Nitrite, UA neg    Leukocytes, UA small (1+) (A) Negative   Probable low-grade diverticulitis even though counts are normal. With her history of think we are obligated to treat her accordingly.  She is allergic to ciprofloxacin so will use Bactrim and Flagyl  There are no Patient Instructions on file for this visit.   Analise Glotfelty, MD 04/28/2015 Normal at 98.9.

## 2015-05-01 ENCOUNTER — Telehealth: Payer: Self-pay

## 2015-05-01 ENCOUNTER — Other Ambulatory Visit: Payer: Self-pay | Admitting: Family Medicine

## 2015-05-01 DIAGNOSIS — N3090 Cystitis, unspecified without hematuria: Secondary | ICD-10-CM

## 2015-05-01 NOTE — Progress Notes (Signed)
Phone conversation with patient. Failed to put in her urine culture the other day at her visit. Decided to do a urine test for cure after completion of the anabolics. She is feeling a little bit better. The mild symptoms of diverticulitis are improving though not 100% well yet. She is very concerned because she is trying to reschedule her orthopedic surgery.

## 2015-05-01 NOTE — Telephone Encounter (Signed)
Patient is requesting her lab results - it's been four days.

## 2015-05-03 NOTE — Telephone Encounter (Signed)
Dr. Alwyn Ren spoke with pt.

## 2015-05-04 ENCOUNTER — Telehealth: Payer: Self-pay | Admitting: Family Medicine

## 2015-05-04 NOTE — Telephone Encounter (Signed)
Dr Alwyn Ren already discussed labs with patient

## 2015-05-05 ENCOUNTER — Ambulatory Visit (INDEPENDENT_AMBULATORY_CARE_PROVIDER_SITE_OTHER): Payer: Medicare Other | Admitting: Family Medicine

## 2015-05-05 ENCOUNTER — Encounter: Payer: Self-pay | Admitting: Family Medicine

## 2015-05-05 VITALS — BP 134/93 | HR 94 | Temp 98.5°F | Resp 16 | Ht 63.0 in | Wt 145.0 lb

## 2015-05-05 DIAGNOSIS — R21 Rash and other nonspecific skin eruption: Secondary | ICD-10-CM

## 2015-05-05 DIAGNOSIS — N390 Urinary tract infection, site not specified: Secondary | ICD-10-CM

## 2015-05-05 DIAGNOSIS — R8281 Pyuria: Secondary | ICD-10-CM

## 2015-05-05 DIAGNOSIS — R1032 Left lower quadrant pain: Secondary | ICD-10-CM | POA: Diagnosis not present

## 2015-05-05 LAB — POCT UA - MICROSCOPIC ONLY
Casts, Ur, LPF, POC: NEGATIVE
Mucus, UA: POSITIVE
Yeast, UA: NEGATIVE

## 2015-05-05 LAB — POCT CBC
Granulocyte percent: 67.3 %G (ref 37–80)
HCT, POC: 44.5 % (ref 37.7–47.9)
Hemoglobin: 13.1 g/dL (ref 12.2–16.2)
Lymph, poc: 2 (ref 0.6–3.4)
MCH, POC: 27 pg (ref 27–31.2)
MCHC: 29.5 g/dL — AB (ref 31.8–35.4)
MCV: 91.6 fL (ref 80–97)
MID (cbc): 0.5 (ref 0–0.9)
MPV: 7.1 fL (ref 0–99.8)
POC Granulocyte: 5.2 (ref 2–6.9)
POC LYMPH PERCENT: 25.7 %L (ref 10–50)
POC MID %: 7 %M (ref 0–12)
Platelet Count, POC: 418 10*3/uL (ref 142–424)
RBC: 4.85 M/uL (ref 4.04–5.48)
RDW, POC: 17.7 %
WBC: 7.8 10*3/uL (ref 4.6–10.2)

## 2015-05-05 LAB — POCT URINALYSIS DIPSTICK
Bilirubin, UA: NEGATIVE
Blood, UA: NEGATIVE
Glucose, UA: NEGATIVE
Ketones, UA: NEGATIVE
Nitrite, UA: NEGATIVE
Protein, UA: NEGATIVE
Spec Grav, UA: 1.025
Urobilinogen, UA: 0.2
pH, UA: 5.5

## 2015-05-05 MED ORDER — TRIAMCINOLONE ACETONIDE 0.1 % EX CREA: 1.0000 "application " | TOPICAL_CREAM | Freq: Two times a day (BID) | CUTANEOUS | Status: DC

## 2015-05-05 NOTE — Patient Instructions (Addendum)
The facial rash is a "fixed drug reaction"  The diverticulitis has resolved.  You may reschedule your hip procedure.

## 2015-05-05 NOTE — Telephone Encounter (Signed)
Pt called stating she is having a reaction to the medications given when she was here for diverticulitis. Her tongue turned white. Pt states she is weak and does not generally feel well. I advised pt to RTC to discuss symptoms. Pt agreed.  sulfamethoxazole-trimethoprim (BACTRIM DS,SEPTRA DS) 800-160 MG per tablet [[id number]  metroNIDAZOLE (FLAGYL) 500 MG tablet [[id number]]

## 2015-05-05 NOTE — Progress Notes (Addendum)
   Subjective:    Patient ID: Melinda Washington, female    DOB: 1940-12-22, 74 y.o.   MRN: [id number] This chart was scribed for Elvina Sidle, MD by Littie Deeds, Medical Scribe. This patient was seen in Room 1 and the patient's care was started at 9:17 PM.   HPI HPI Comments: Melinda Washington is a 74 y.o. female who presents to the [facility] complaining of left lower quadrant pain which is intermittent and mild. She was seen recently and diagnosed with recurrent diverticulitis and started on Septra and Flagyl. After starting the latter 2 medications, patient felt tired and generally run down. She stopped them. In the meantime she developed a progressive left facial rash overlying her previous reddened and rough patch over the lateral malar arch.  Patient was told she might have a urinary infection at the last visit, but a urine culture was not run. She's not having any urinary symptoms now  Patient has a history of recurrent diverticulitis. She takes magnesium citrate regularly or other problems and this helps keep her regular. She's been having regular bowel movements.   She was recently scheduled for hip surgery but this is canceled because she was diagnosed with the diverticulitis last week. She wants to know if she can proceed with surgery.  Patient is reluctant to take anything for the facial rash.  Review of Systems No fever or dysuria or flank pain.    Objective:   Physical Exam CONSTITUTIONAL: Well developed/well nourished HEAD: Normocephalic/atraumatic EYES: EOM/PERRL ENMT: Mucous membranes moist NECK: supple no meningeal signs SPINE: entire spine nontender CV: S1/S2 noted, no murmurs/rubs/gallops noted LUNGS: Lungs are clear to auscultation bilaterally, no apparent distress ABDOMEN: soft, nontender, no rebound or guarding GU: no cva tenderness NEURO: Pt is awake/alert, moves all extremitiesx4 EXTREMITIES: pulses normal, full ROM SKIN: warm, color  normal PSYCH: no abnormalities of mood noted  Skin: Patient has a roughened area which is hyperpigmented over the left lateral malar region. I spent over 45 minutes listening to the patient complain about her rash, complain about her recent abdominal pain, complain about her bills from last year, complain about her lack of getting more sumatriptan because of her insurance limitation     Assessment & Plan:  Fixed drug reaction Recent left lower quadrant discomfort Hip osteoarthritis  Plan: Stop taking the antibiotic Triamcinolone cream 3 times a day to the left malar area  This chart was scribed in my presence and reviewed by me personally.    ICD-9-CM ICD-10-CM   1. LLQ abdominal pain 789.04 R10.32 POCT CBC     POCT urinalysis dipstick     POCT UA - Microscopic Only  2. Facial rash 782.1 R21 triamcinolone cream (KENALOG) 0.1 %  3. Pyuria 791.9 N39.0 Urine culture     Signed, Elvina Sidle, MD     Elvina Sidle, MD

## 2015-05-06 ENCOUNTER — Encounter: Payer: Self-pay | Admitting: Family Medicine

## 2015-05-07 LAB — URINE CULTURE
Colony Count: NO GROWTH
Organism ID, Bacteria: NO GROWTH

## 2015-05-08 ENCOUNTER — Telehealth: Payer: Self-pay

## 2015-05-08 NOTE — Telephone Encounter (Signed)
Pt requested a note saying she is free of infection. She needs the note so she can be cleared to have hip surgery. Surgery is to be done at [facility] by Dr. Margie Billet. Pt was waiting on urine culture results; urine culture results were negative.  Pt needs note ASAP.  Pt is to call back with a phone number and fax number for Dr. Nicola Police office.  Note will need to be faxed.

## 2015-05-08 NOTE — Telephone Encounter (Signed)
Melinda Washington Patient has called back with dr Lamar Sprinkles number [phone] or [phone] and fax number [phone]  Please call patient once the fax has been done

## 2015-05-08 NOTE — Telephone Encounter (Signed)
I wrote the letter clearing patient.  Please fax to Dr. Lamar Sprinkles at [phone]

## 2015-05-09 NOTE — Telephone Encounter (Signed)
Pt called asking [place] to refax this letter. She says Dr. Nicola Police office didn't receive it. Fax #[phone]

## 2015-05-09 NOTE — Telephone Encounter (Signed)
Faxed letter

## 2015-05-09 NOTE — Telephone Encounter (Signed)
Pt informed

## 2015-05-09 NOTE — Telephone Encounter (Signed)
Melinda Washington will re-fax.

## 2015-05-16 DIAGNOSIS — I77810 Thoracic aortic ectasia: Secondary | ICD-10-CM | POA: Diagnosis not present

## 2015-05-16 DIAGNOSIS — Z79899 Other long term (current) drug therapy: Secondary | ICD-10-CM | POA: Diagnosis not present

## 2015-05-16 DIAGNOSIS — Z888 Allergy status to other drugs, medicaments and biological substances status: Secondary | ICD-10-CM | POA: Diagnosis not present

## 2015-05-16 DIAGNOSIS — F419 Anxiety disorder, unspecified: Secondary | ICD-10-CM | POA: Diagnosis not present

## 2015-05-16 DIAGNOSIS — G4700 Insomnia, unspecified: Secondary | ICD-10-CM | POA: Diagnosis not present

## 2015-05-16 DIAGNOSIS — Z9089 Acquired absence of other organs: Secondary | ICD-10-CM | POA: Diagnosis not present

## 2015-05-16 DIAGNOSIS — I2510 Atherosclerotic heart disease of native coronary artery without angina pectoris: Secondary | ICD-10-CM | POA: Diagnosis not present

## 2015-05-16 DIAGNOSIS — R079 Chest pain, unspecified: Secondary | ICD-10-CM | POA: Diagnosis not present

## 2015-05-16 DIAGNOSIS — R911 Solitary pulmonary nodule: Secondary | ICD-10-CM | POA: Diagnosis not present

## 2015-05-16 DIAGNOSIS — Z91040 Latex allergy status: Secondary | ICD-10-CM | POA: Diagnosis not present

## 2015-05-16 DIAGNOSIS — M1612 Unilateral primary osteoarthritis, left hip: Secondary | ICD-10-CM | POA: Diagnosis not present

## 2015-05-16 DIAGNOSIS — Z90710 Acquired absence of both cervix and uterus: Secondary | ICD-10-CM | POA: Diagnosis not present

## 2015-05-16 DIAGNOSIS — R0789 Other chest pain: Secondary | ICD-10-CM | POA: Diagnosis not present

## 2015-05-16 DIAGNOSIS — Z01818 Encounter for other preprocedural examination: Secondary | ICD-10-CM | POA: Diagnosis not present

## 2015-05-16 DIAGNOSIS — G43909 Migraine, unspecified, not intractable, without status migrainosus: Secondary | ICD-10-CM | POA: Diagnosis not present

## 2015-05-16 DIAGNOSIS — Z882 Allergy status to sulfonamides status: Secondary | ICD-10-CM | POA: Diagnosis not present

## 2015-05-16 DIAGNOSIS — I517 Cardiomegaly: Secondary | ICD-10-CM | POA: Diagnosis not present

## 2015-05-16 DIAGNOSIS — Z880 Allergy status to penicillin: Secondary | ICD-10-CM | POA: Diagnosis not present

## 2015-05-16 DIAGNOSIS — I10 Essential (primary) hypertension: Secondary | ICD-10-CM | POA: Diagnosis not present

## 2015-05-19 DIAGNOSIS — I517 Cardiomegaly: Secondary | ICD-10-CM | POA: Diagnosis not present

## 2015-05-19 DIAGNOSIS — I10 Essential (primary) hypertension: Secondary | ICD-10-CM | POA: Diagnosis not present

## 2015-05-19 DIAGNOSIS — R911 Solitary pulmonary nodule: Secondary | ICD-10-CM | POA: Diagnosis not present

## 2015-05-19 DIAGNOSIS — R0789 Other chest pain: Secondary | ICD-10-CM | POA: Diagnosis not present

## 2015-05-19 DIAGNOSIS — I2510 Atherosclerotic heart disease of native coronary artery without angina pectoris: Secondary | ICD-10-CM | POA: Diagnosis not present

## 2015-05-19 DIAGNOSIS — R079 Chest pain, unspecified: Secondary | ICD-10-CM | POA: Diagnosis not present

## 2015-05-19 DIAGNOSIS — I77810 Thoracic aortic ectasia: Secondary | ICD-10-CM | POA: Diagnosis not present

## 2015-06-02 ENCOUNTER — Encounter: Payer: Self-pay | Admitting: Family Medicine

## 2015-06-02 ENCOUNTER — Ambulatory Visit (INDEPENDENT_AMBULATORY_CARE_PROVIDER_SITE_OTHER): Payer: Medicare Other | Admitting: Family Medicine

## 2015-06-02 VITALS — BP 133/88 | HR 73 | Temp 98.5°F | Resp 16 | Ht 63.5 in | Wt 140.0 lb

## 2015-06-02 DIAGNOSIS — R0789 Other chest pain: Secondary | ICD-10-CM | POA: Diagnosis not present

## 2015-06-02 DIAGNOSIS — I10 Essential (primary) hypertension: Secondary | ICD-10-CM | POA: Diagnosis not present

## 2015-06-02 DIAGNOSIS — R600 Localized edema: Secondary | ICD-10-CM | POA: Diagnosis not present

## 2015-06-02 DIAGNOSIS — M25511 Pain in right shoulder: Secondary | ICD-10-CM | POA: Diagnosis not present

## 2015-06-02 DIAGNOSIS — F411 Generalized anxiety disorder: Secondary | ICD-10-CM | POA: Diagnosis not present

## 2015-06-02 MED ORDER — LOSARTAN POTASSIUM 100 MG PO TABS: 100.0000 mg | ORAL_TABLET | Freq: Every day | ORAL | Status: DC

## 2015-06-02 MED ORDER — ALPRAZOLAM 0.25 MG PO TABS: 0.2500 mg | ORAL_TABLET | Freq: Three times a day (TID) | ORAL | Status: DC | PRN

## 2015-06-02 NOTE — Progress Notes (Addendum)
This chart was scribed for Melinda Sidle, MD by Stann Ore, medical scribe at [facility].The patient was seen in exam room 9 and the patient's care was started at 6:42 PM.  Patient ID: Melinda Washington MRN: [id number], DOB: 1941-02-03, 74 y.o. Date of Encounter: 06/02/2015  Primary Physician: No primary care provider on file.  Chief Complaint:  Chief Complaint  Patient presents with   Hypertension    Hip Surgery Cancelled on 05/19/2015 due to elevated BP    HPI:  Melinda Washington is a 74 y.o. female who presents to [facility] complaining of elevated BP.  Her hip surgery was cancelled on Sept 12 2016 because of "stroke high BP" (203/123) measured in OR prep room. She was suggested to go down to ER. She was prescribed amlodipine. She reports that her BP dropped 40 points after an hour and a half. She dislikes taking amlodipine because it causes her feet to swell and also makes her fatigue. She was anxious prior to surgery and had lack of sleep. Her son is a doctor and wants her to have a stress test because she did have some left lateral chest pain prior to the surgery.  Prior to surgery, she has a sore spot on her left flank at the long thoracic nerve. When she turns on her side during sleep, she feels throbbing and pulsating in the area. She finds relief after rubbing the area. Her surgeon Margie Billet) is leaving his current practice and moving to [place] on Oct 3rd. She has an appointment tomorrow with the orthopedic surgeon taking Dr. Nicola Police place. She is still hoping to have the hip replaced soon.  Her right shoulder has been bothering her. When she lifts her right arm, she feels intermittent pain at around her right deltoid. She previously had right arm pain and was recommended acupuncture to relief. She does not have any pain when moving her right shoulder tonight.  She also noticed a small bump on her right shoulder a couple months ago.  It has, and gone a couple times in the past and is not present tonight.  Past Medical History  Diagnosis Date   Diverticulitis      Home Meds: Prior to Admission medications   Medication Sig Start Date End Date Taking? Authorizing Provider  SUMAtriptan (IMITREX) 100 MG tablet May take one tablet on the onset of headache. Then repeat in 2 hours as needed. Maximum 2 doses in 24 hours Patient not taking: Reported on 04/28/2015 02/25/15   Melinda Sidle, MD  triamcinolone cream (KENALOG) 0.1 % Apply 1 application topically 2 (two) times daily. 05/05/15   Melinda Sidle, MD    Allergies:  Allergies  Allergen Reactions   Sulfamethoxazole Rash   Ciprofloxacin     Pt says it makes her feeling "very weak."    Codeine Nausea And Vomiting    All pain medications cause some degree of nausea (moderate to severe)- able to tolerate when premedicated with antiemetic   Latex Other (See Comments)    Redness & burning   Penicillins    Ketorolac Tromethamine Anxiety    Social History   Social History   Marital Status: Single    Spouse Name: N/A   Number of Children: N/A   Years of Education: N/A   Occupational History   Not on file.   Social History Main Topics   Smoking status: Never Smoker    Smokeless tobacco: Never Used  Alcohol Use: No   Drug Use: No   Sexual Activity: Not on file   Other Topics Concern   Not on file   Social History Narrative     Review of Systems: Constitutional: negative for chills, fever, night sweats, weight changes; positive for fatigue  HEENT: negative for vision changes, hearing loss, congestion, rhinorrhea, ST, epistaxis, or sinus pressure Cardiovascular: negative for chest pain or palpitations Respiratory: negative for hemoptysis, wheezing, shortness of breath, or cough Abdominal: negative for abdominal pain, nausea, vomiting, diarrhea, or constipation Dermatological: negative for rash Neurologic: negative for headache,  dizziness, or syncope Musc: positive for feet swelling (bilaterally), myalgia (left side rib, right arm) All other systems reviewed and are otherwise negative with the exception to those above and in the HPI.  Physical Exam: Blood pressure 133/88, pulse 73, temperature 98.5 F (36.9 C), temperature source Oral, resp. rate 16, height 5' 3.5" (1.613 m), weight 140 lb (63.504 kg), last menstrual period 04/04/2012, SpO2 95 %., Body mass index is 24.41 kg/(m^2). General: Well developed, well nourished, in no acute distress. Head: Normocephalic, atraumatic, eyes without discharge, sclera non-icteric, nares are without discharge. Bilateral auditory canals clear, TM's are without perforation, pearly grey and translucent with reflective cone of light bilaterally. Oral cavity moist, posterior pharynx without exudate, erythema, peritonsillar abscess, or post nasal drip.  Neck: Supple. No thyromegaly. Full ROM. No lymphadenopathy. Lungs: Clear bilaterally to auscultation without wheezes, rales, or rhonchi. Breathing is unlabored. Heart: RRR with S1 S2. No murmurs, rubs, or gallops appreciated. Abdomen: Soft, non-tender, non-distended with normoactive bowel sounds. No hepatomegaly. No rebound/guarding. No obvious abdominal masses. Msk:  Strength and tone normal for age. Extremities/Skin: Warm and dry. No clubbing or cyanosis. No edema. No rashes or suspicious lesions. Neuro: Alert and oriented X 3. Moves all extremities spontaneously. Gait is normal. CNII-XII grossly in tact. Psych:  Responds to questions appropriately with a normal affect.   I reviewed the preop vital signs the patient brought in with her tonight. I also reviewed the note her son wrote me. I spent at least 40 minutes face-to-face with the patient going overall these problems and trying to reassure her.  ASSESSMENT AND PLAN:  75 y.o. year old female with multiple problems: Transient hypertension preop, anxiety preop, tender left lateral  chest, intermittent pain in the right shoulder. This chart was scribed in my presence and reviewed by me personally.    ICD-9-CM ICD-10-CM   1. Essential hypertension 401.9 I10 losartan (COZAAR) 100 MG tablet  2. Anxiety state 300.00 F41.1 ALPRAZolam (XANAX) 0.25 MG tablet  3. Atypical chest pain 786.59 R07.89 Ambulatory referral to Cardiology  4. Right shoulder pain 719.41 M25.511   5. Pedal edema 782.3 R60.0       By signing my name below, I, Stann Ore, attest that this documentation has been prepared under the direction and in the presence of Melinda Sidle, MD. Electronically Signed: Stann Ore, Scribe. 06/02/2015 , 8:47 PM .  Signed, Melinda Sidle, MD 06/02/2015 8:47 PM

## 2015-06-02 NOTE — Patient Instructions (Signed)
I will need to call you tomorrow regarding the acupuncture specialist. In the meantime, I have changed her blood pressure medicine to losartan hence I want you to stop the amlodipine. Also I have ordered a prescription for Xanax that you can take at night or during the day for anxiety or difficulty sleeping.

## 2015-06-03 ENCOUNTER — Telehealth: Payer: Self-pay

## 2015-06-03 DIAGNOSIS — Z882 Allergy status to sulfonamides status: Secondary | ICD-10-CM | POA: Diagnosis not present

## 2015-06-03 DIAGNOSIS — I10 Essential (primary) hypertension: Secondary | ICD-10-CM | POA: Diagnosis not present

## 2015-06-03 DIAGNOSIS — Z880 Allergy status to penicillin: Secondary | ICD-10-CM | POA: Diagnosis not present

## 2015-06-03 DIAGNOSIS — M1612 Unilateral primary osteoarthritis, left hip: Secondary | ICD-10-CM | POA: Diagnosis not present

## 2015-06-03 DIAGNOSIS — Z9889 Other specified postprocedural states: Secondary | ICD-10-CM | POA: Diagnosis not present

## 2015-06-03 DIAGNOSIS — Z885 Allergy status to narcotic agent status: Secondary | ICD-10-CM | POA: Diagnosis not present

## 2015-06-03 NOTE — Telephone Encounter (Signed)
I put this in the mail already.

## 2015-06-03 NOTE — Telephone Encounter (Signed)
Dr L advised me that he has written a letter to pt re: treatment w/acupuncture. I LMOM at H # to CB to ask pt if she would like to p/up letter or have [place] mail it to her. Mobile # had someone else's name on VM so did not LM there. Dr L put letter in nurses' box at TL desk.

## 2015-06-05 ENCOUNTER — Telehealth: Payer: Self-pay

## 2015-06-05 NOTE — Telephone Encounter (Signed)
Patient is calling about getting a handicap sticker because if her extreme hip pain. Please call!

## 2015-06-06 ENCOUNTER — Encounter: Payer: Self-pay | Admitting: Cardiology

## 2015-06-06 DIAGNOSIS — R0789 Other chest pain: Secondary | ICD-10-CM | POA: Diagnosis not present

## 2015-06-06 NOTE — Progress Notes (Signed)
[place] wanting 1 month.

## 2015-06-06 NOTE — Telephone Encounter (Signed)
Please complete handicap sticker for 2 months because of hip arthritis and limited ability to walk

## 2015-06-06 NOTE — Progress Notes (Signed)
Patient ID: Melinda Washington, female   DOB: 1940-09-20, 74 y.o.   MRN: [id number]   Melinda, Washington    Date of visit:  06/06/2015 DOB:  [birth year]    Age:  74 yrs. Medical record number:  [id number]     Account number:  [id number] Primary Care Provider: Elvina Sidle ____________________________ CURRENT DIAGNOSES  1. Chest pain  2. Essential hypertension ____________________________ ALLERGIES  Amlodipine, Edema  Codeine, Nausea  Ketorolac, Anxiety  Latex, Rash  Penicillins, Rash  Sulfa (Sulfonamide Antibiotics), Rash ____________________________ MEDICATIONS  1. Imitrex 100 mg tablet, PRN  2. losartan 100 mg tablet, 1 p.o. daily  3. magnesium citrate oral solution, BID  4. Vitamin C 1,000 mg tablet, Take as directed  5. Vitamin D3 1,000 unit capsule, 1 p.o. daily  6. Probiotic 15 billion cell capsule, 1 p.o. daily ____________________________ HISTORY OF PRESENT ILLNESS This 74 year old female is seen for preoperative cardiac evaluation and evaluation of chest pain. The patient has a history of diverticulitis and had an intestinal obstruction a few years ago followed by a hernia repair and bladder tach. 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 She has finally decided on a physician who practiced near [place] and was due to have surgery. While in the holding area prior to surgery she had significant hypertension and then complained of some left chest pain that was sore to touch on her lateral chest wall. She evidently was sent from therapy emergency room where she was given amlodipine and later had a CT scan of her chest that did not show anything. Evidently the surgery was canceled and she was sent home for better blood pressure control. She felt that amlodipine caused edema and she later was switched to Voltaren. She does not have any pressure-like chest pain suggestive of angina. She is  unable to exercise due to severe hip pain. She does have a family history of cardiac disease. She takes her blood pressure with a blood pressure cuff were of the wrist at home she is higher than the number here. She denies PND, orthopnea or edema. She admits to being under a great deal of situational stress and sighs frequently during the examination today and is quite talkative. ____________________________ PAST HISTORY  Past Medical Illnesses:  diverticulitis, migraine headaches, hypertension, hyperlipidemia, nephrolithiasis;  Cardiovascular Illnesses:  no previous history of cardiac disease;  Surgical Procedures:  bunion surgery, carpal tunnel release, hysterectomy, hernia repair, laparatomy for intestinal obstruction 1993, bladder tack at the time of hernia repair;  NYHA Classification:  I;  Cardiology Procedures-Invasive:  no previous interventional or invasive cardiology procedures;  Cardiology Procedures-Noninvasive:  no previous non-invasive cardiovascular testing;  LVEF not documented,   ____________________________ CARDIO-PULMONARY TEST DATES EKG Date:  06/06/2015;   ____________________________ FAMILY HISTORY Brother -- Brother dead, Brother dead Father -- Father dead, Abdominal aortic aneurysm Mother -- Mother dead, Heart Attack, Sudden death Adult ____________________________ SOCIAL HISTORY Alcohol Use:  does not use alcohol;  Smoking:  nonsmoker;  Diet:  regular diet;  Lifestyle:  divorced and 1 son;  Exercise:  exercise is limited due to physical disability;  Occupation:  retired and Airline pilot;  Residence:  lives alone;   ____________________________ REVIEW OF SYSTEMS General:  weight loss of approximately 20 lbs  Integumentary:no rashes or new skin lesions. Eyes: denies diplopia, history of glaucoma or visual problems. Ears, Nose, Throat, Mouth:  denies any hearing loss, epistaxis, hoarseness or  difficulty speaking. Respiratory: denies dyspnea, cough, wheezing or hemoptysis.  Cardiovascular:  please review HPI Abdominal: diverticulitisGenitourinary-Female: stress incontinence Musculoskeletal:  arthritis of the hip Neurological:  migraine headaches Psychiatric:  situational stress Hematological/Immunologic:  denies any food allergies, bleeding disorders. ____________________________ PHYSICAL EXAMINATION VITAL SIGNS  Blood Pressure:  134/90 Standing, Left arm, regular cuff  , 130/90 Sitting, Left arm and regular cuff   Pulse:  72/min. Weight:  143.00 lbs. Height:  63.5"BMI: 25  Constitutional:  pleasant white female, in no acute distress, talkative, sighing frequentlly Skin:  warm and dry to touch, no apparent skin lesions, or masses noted. Head:  normocephalic, normal hair pattern, no masses or tenderness Eyes:  EOMS Intact, PERRLA, C and S clear, Funduscopic exam not done. ENT:  ears, nose and throat reveal no gross abnormalities.  Dentition good. Neck:  supple, without massess. No JVD, thyromegaly or carotid bruits. Carotid upstroke normal. Chest:  normal symmetry, clear to auscultation. Cardiac:  regular rhythm, normal S1 and S2, No S3 or S4, no murmurs, gallops or rubs detected. Abdomen:  abdomen soft,non-tender, no masses, no hepatospenomegaly, or aneurysm noted Peripheral Pulses:  the femoral,dorsalis pedis, and posterior tibial pulses are full and equal bilaterally with no bruits auscultated. Extremities & Back:  no deformities, clubbing, cyanosis, erythema or edema observed. Normal muscle strength and tone. Abnormal gait with swinging of left hip. Neurological:  no gross motor or sensory deficits noted, affect appropriate, oriented x3. ____________________________ IMPRESSIONS/PLAN  1. Chest pain with atypical features in a patient with risk factors of family history, hypertension and questionable hyperlipidemia 2. Significant pain in need of surgery 3. Hypertension borderline 4. Hyperlipidemia currently untreated 5. Anxiety 6. History of migraine  headaches  Recommendations:  Her EKG is normal and her examination is normal. She is unable to walk on a standard exercise test. In light of her risk factors I have recommended a pharmacologic perfusion study to be sure that she does not have significant ischemia prior to surgery. I recommended obtaining a non-brought blood pressure cuff to use and to monitor her blood pressures. We will check her cholesterol when she returns. Thank you for asking me to see her with you.  ____________________________ TODAYS ORDERS  1. Lexiscan 1 day: Today  2. 12 Lead EKG: Today                       ____________________________ Cardiology Physician:  Darden Palmer MD [facility]

## 2015-06-07 NOTE — Telephone Encounter (Signed)
In pick up draw. 

## 2015-06-09 NOTE — Telephone Encounter (Signed)
Advised pt ready to pick up. 

## 2015-06-09 NOTE — Telephone Encounter (Signed)
Also mailed copy.

## 2015-06-17 DIAGNOSIS — M1612 Unilateral primary osteoarthritis, left hip: Secondary | ICD-10-CM | POA: Diagnosis not present

## 2015-06-23 ENCOUNTER — Telehealth: Payer: Self-pay

## 2015-06-23 NOTE — Telephone Encounter (Signed)
Patient called in wanting to know if Dr. Elbert EwingsL could re-write the application that he did for her for the handicap sticker, she states that he only filled it out for 2 months and she needs it at least through feb 2017 because she doesn't even have the operation for her hip set up yet, stated that it had been cancelled twice and that Dr. Elbert EwingsL was aware of this. Her call back number is [phone]. She also states that sending it through the mail that she just received it today 06/23/15 and from looking at her chart it was mailed on 06/09/15 so you may want to call her to come pick it up instead of mailing it to her since it took so long.

## 2015-06-24 NOTE — Telephone Encounter (Signed)
Okay to fill out handicap sticker through Feb 17.  Please leave in my box to sign.

## 2015-06-24 NOTE — Telephone Encounter (Signed)
Paperwork placed into Dr. Cain SaupeL's box.

## 2015-06-25 NOTE — Telephone Encounter (Signed)
In pick up draw. Pt notified. 

## 2015-06-25 NOTE — Telephone Encounter (Signed)
°  Pt called and really need to have form filled out. Please call [phone]

## 2015-07-03 DIAGNOSIS — R0789 Other chest pain: Secondary | ICD-10-CM | POA: Diagnosis not present

## 2015-07-03 DIAGNOSIS — I10 Essential (primary) hypertension: Secondary | ICD-10-CM | POA: Diagnosis not present

## 2015-07-09 DIAGNOSIS — M1612 Unilateral primary osteoarthritis, left hip: Secondary | ICD-10-CM | POA: Diagnosis not present

## 2015-07-15 ENCOUNTER — Telehealth: Payer: Self-pay | Admitting: Family Medicine

## 2015-07-15 NOTE — Telephone Encounter (Signed)
Spoke with patient and she refuses to have her annual exam due to surgery in December.  She does not want a colonoscopy or mammogram done ever.

## 2015-07-21 ENCOUNTER — Telehealth: Payer: Self-pay

## 2015-07-21 NOTE — Telephone Encounter (Signed)
Pt called to confirm if records from Dr. Leeroy BockW. Spencer Tilley's office had been received.   Please call once received after 11 AM [phone]); VM OKAY

## 2015-07-22 NOTE — Telephone Encounter (Signed)
Nothing scanned into Epic, nothing in Dr Jeanice LimBox and nothing in scan box. Will keep checking.

## 2015-08-12 ENCOUNTER — Encounter (HOSPITAL_COMMUNITY): Payer: Self-pay

## 2015-08-12 ENCOUNTER — Encounter (HOSPITAL_COMMUNITY)
Admission: RE | Admit: 2015-08-12 | Discharge: 2015-08-12 | Disposition: A | Discharge status: Home / Self Care | Payer: Medicare Other | Source: Ambulatory Visit | Attending: Orthopedic Surgery | Admitting: Orthopedic Surgery

## 2015-08-12 DIAGNOSIS — Z01818 Encounter for other preprocedural examination: Secondary | ICD-10-CM | POA: Insufficient documentation

## 2015-08-12 DIAGNOSIS — M1612 Unilateral primary osteoarthritis, left hip: Secondary | ICD-10-CM | POA: Diagnosis not present

## 2015-08-12 HISTORY — DX: Headache: R51

## 2015-08-12 HISTORY — DX: Essential (primary) hypertension: I10

## 2015-08-12 HISTORY — DX: Unspecified osteoarthritis, unspecified site: M19.90

## 2015-08-12 HISTORY — DX: Anxiety disorder, unspecified: F41.9

## 2015-08-12 HISTORY — DX: Headache, unspecified: R51.9

## 2015-08-12 LAB — SURGICAL PCR SCREEN
MRSA, PCR: NEGATIVE
Staphylococcus aureus: NEGATIVE

## 2015-08-12 LAB — BASIC METABOLIC PANEL
Anion gap: 9 (ref 5–15)
BUN: 17 mg/dL (ref 6–20)
CHLORIDE: 109 mmol/L (ref 101–111)
CO2: 22 mmol/L (ref 22–32)
CREATININE: 0.84 mg/dL (ref 0.44–1.00)
Calcium: 9.4 mg/dL (ref 8.9–10.3)
GFR calc Af Amer: >60 mL/min (ref >60)
GFR calc non Af Amer: >60 mL/min (ref >60)
GLUCOSE: 91 mg/dL (ref 65–99)
POTASSIUM: 4.3 mmol/L (ref 3.5–5.1)
SODIUM: 140 mmol/L (ref 135–145)

## 2015-08-12 LAB — URINALYSIS, ROUTINE W REFLEX MICROSCOPIC
Bilirubin Urine: NEGATIVE
Glucose, UA: NEGATIVE mg/dL
HGB URINE DIPSTICK: NEGATIVE
Ketones, ur: NEGATIVE mg/dL
Leukocytes, UA: NEGATIVE
NITRITE: NEGATIVE
PROTEIN: NEGATIVE mg/dL
SPECIFIC GRAVITY, URINE: 1.016 (ref 1.005–1.030)
pH: 6 (ref 5.0–8.0)

## 2015-08-12 LAB — CBC
HEMATOCRIT: 35.7 % — AB (ref 36.0–46.0)
Hemoglobin: 11.4 g/dL — ABNORMAL LOW (ref 12.0–15.0)
MCH: 28.7 pg (ref 26.0–34.0)
MCHC: 31.9 g/dL (ref 30.0–36.0)
MCV: 89.9 fL (ref 78.0–100.0)
PLATELETS: 297 10*3/uL (ref 150–400)
RBC: 3.97 MIL/uL (ref 3.87–5.11)
RDW: 13.1 % (ref 11.5–15.5)
WBC: 6.6 10*3/uL (ref 4.0–10.5)

## 2015-08-12 LAB — PROTIME-INR
INR: 0.96 (ref 0.00–1.49)
Prothrombin Time: 13 seconds (ref 11.6–15.2)

## 2015-08-12 LAB — APTT: APTT: 30 s (ref 24–37)

## 2015-08-12 LAB — ABO/RH: ABO/RH(D): O POS

## 2015-08-12 NOTE — Patient Instructions (Addendum)
Melinda SawyersRosemarie W Washington  08/12/2015   Your procedure is scheduled on:  08-19-15  [facility]  [facility] to 3rd floor to  Short Stay Center at  10:00 AM.  Call this number if you have problems the morning of surgery [phone]   Remember: ONLY 1 PERSON MAY GO WITH YOU TO SHORT STAY TO GET  READY MORNING OF YOUR SURGERY.  Do not eat food or drink liquids :After Midnight.     Take these medicines the morning of surgery with A SIP OF WATER: Imitrex if needed, Xanax if needed DO NOT TAKE ANY DIABETIC MEDICATIONS DAY OF YOUR SURGERY                               You may not have any metal on your body including hair pins and              piercings  Do not wear jewelry, make-up, lotions, powders or perfumes, deodorant             Do not wear nail polish.  Do not shave  48 hours prior to surgery.               Do not bring valuables to the hospital. [facility] IS NOT             RESPONSIBLE   FOR VALUABLES.  Contacts, dentures or bridgework may not be worn into surgery.  Leave suitcase in the car. After surgery it may be brought to your room.       Special Instructions: coughing and deep breathing exercises, leg exercises              Please read over the following fact sheets you were given: _____________________________________________________________________             [facility] - Preparing for [facility] Before surgery, you can play an important role.  Because skin is not sterile, your skin needs to be as free of germs as possible.  You can reduce the number of germs on your skin by washing with CHG (chlorahexidine gluconate) soap before surgery.  CHG is an antiseptic cleaner which kills germs and bonds with the skin to continue killing germs even after washing. Please DO NOT use if you have an allergy to CHG or antibacterial soaps.  If your skin becomes reddened/irritated stop using the CHG and inform your nurse when you arrive  at Short Stay. Do not shave (including legs and underarms) for at least 48 hours prior to the first CHG shower.  You may shave your face/neck. Please follow these instructions carefully:  1.  Shower with CHG Soap the night before surgery and the  morning of Surgery.  2.  If you choose to wash your hair, wash your hair first as usual with your  normal  shampoo.  3.  After you shampoo, rinse your hair and body thoroughly to remove the  shampoo.                           4.  Use CHG as you would any other liquid soap.  You can apply chg directly  to the skin and wash  Gently with a scrungie or clean washcloth.  5.  Apply the CHG Soap to your body ONLY FROM THE NECK DOWN.   Do not use on face/ open                           Wound or open sores. Avoid contact with eyes, ears mouth and genitals (private parts).                       Wash face,  Genitals (private parts) with your normal soap.             6.  Wash thoroughly, paying special attention to the area where your surgery  will be performed.  7.  Thoroughly rinse your body with warm water from the neck down.  8.  DO NOT shower/wash with your normal soap after using and rinsing off  the CHG Soap.                9.  Pat yourself dry with a clean towel.            10.  Wear clean pajamas.            11.  Place clean sheets on your bed the night of your first shower and do not  sleep with pets. Day of Surgery : Do not apply any lotions/deodorants the morning of surgery.  Please wear clean clothes to the hospital/surgery center.  FAILURE TO FOLLOW THESE INSTRUCTIONS MAY RESULT IN THE CANCELLATION OF YOUR SURGERY PATIENT SIGNATURE_________________________________  NURSE SIGNATURE__________________________________  ________________________________________________________________________   Adam Phenix  An incentive spirometer is a tool that can help keep your lungs clear and active. This tool measures how well you  are filling your lungs with each breath. Taking long deep breaths may help reverse or decrease the chance of developing breathing (pulmonary) problems (especially infection) following:  A long period of time when you are unable to move or be active. BEFORE THE PROCEDURE   If the spirometer includes an indicator to show your best effort, your nurse or respiratory therapist will set it to a desired goal.  If possible, sit up straight or lean slightly forward. Try not to slouch.  Hold the incentive spirometer in an upright position. INSTRUCTIONS FOR USE   Sit on the edge of your bed if possible, or sit up as far as you can in bed or on a chair.  Hold the incentive spirometer in an upright position.  Breathe out normally.  Place the mouthpiece in your mouth and seal your lips tightly around it.  Breathe in slowly and as deeply as possible, raising the piston or the ball toward the top of the column.  Hold your breath for 3-5 seconds or for as long as possible. Allow the piston or ball to fall to the bottom of the column.  Remove the mouthpiece from your mouth and breathe out normally.  Rest for a few seconds and repeat Steps 1 through 7 at least 10 times every 1-2 hours when you are awake. Take your time and take a few normal breaths between deep breaths.  The spirometer may include an indicator to show your best effort. Use the indicator as a goal to work toward during each repetition.  After each set of 10 deep breaths, practice coughing to be sure your lungs are clear. If you have an incision (the cut made at the time of surgery),  support your incision when coughing by placing a pillow or rolled up towels firmly against it. Once you are able to get out of bed, walk around indoors and cough well. You may stop using the incentive spirometer when instructed by your caregiver.  RISKS AND COMPLICATIONS  Take your time so you do not get dizzy or light-headed.  If you are in pain, you may  need to take or ask for pain medication before doing incentive spirometry. It is harder to take a deep breath if you are having pain. AFTER USE  Rest and breathe slowly and easily.  It can be helpful to keep track of a log of your progress. Your caregiver can provide you with a simple table to help with this. If you are using the spirometer at home, follow these instructions: [place] IF:   You are having difficultly using the spirometer.  You have trouble using the spirometer as often as instructed.  Your pain medication is not giving enough relief while using the spirometer.  You develop fever of 100.5 F (38.1 C) or higher. SEEK IMMEDIATE MEDICAL CARE IF:   You cough up bloody sputum that had not been present before.  You develop fever of 102 F (38.9 C) or greater.  You develop worsening pain at or near the incision site. MAKE SURE YOU:   Understand these instructions.  Will watch your condition.  Will get help right away if you are not doing well or get worse. Document Released: 01/03/2007 Document Revised: 11/15/2011 Document Reviewed: 03/06/2007 ExitCare Patient Information 2014 ExitCare, [place].   ________________________________________________________________________  WHAT IS A BLOOD TRANSFUSION? Blood Transfusion Information  A transfusion is the replacement of blood or some of its parts. Blood is made up of multiple cells which provide different functions.  Red blood cells carry oxygen and are used for blood loss replacement.  White blood cells fight against infection.  Platelets control bleeding.  Plasma helps clot blood.  Other blood products are available for specialized needs, such as hemophilia or other clotting disorders. BEFORE THE TRANSFUSION  Who gives blood for transfusions?   Healthy volunteers who are fully evaluated to make sure their blood is safe. This is blood bank blood. Transfusion therapy is the safest it has ever been in  the practice of medicine. Before blood is taken from a donor, a complete history is taken to make sure that person has no history of diseases nor engages in risky social behavior (examples are intravenous drug use or sexual activity with multiple partners). The donor's travel history is screened to minimize risk of transmitting infections, such as malaria. The donated blood is tested for signs of infectious diseases, such as HIV and hepatitis. The blood is then tested to be sure it is compatible with you in order to minimize the chance of a transfusion reaction. If you or a relative donates blood, this is often done in anticipation of surgery and is not appropriate for emergency situations. It takes many days to process the donated blood. RISKS AND COMPLICATIONS Although transfusion therapy is very safe and saves many lives, the main dangers of transfusion include:   Getting an infectious disease.  Developing a transfusion reaction. This is an allergic reaction to something in the blood you were given. Every precaution is taken to prevent this. The decision to have a blood transfusion has been considered carefully by your caregiver before blood is given. Blood is not given unless the benefits outweigh the risks. AFTER THE TRANSFUSION  Right after receiving a blood transfusion, you will usually feel much better and more energetic. This is especially true if your red blood cells have gotten low (anemic). The transfusion raises the level of the red blood cells which carry oxygen, and this usually causes an energy increase.  The nurse administering the transfusion will monitor you carefully for complications. HOME CARE INSTRUCTIONS  No special instructions are needed after a transfusion. You may find your energy is better. Speak with your caregiver about any limitations on activity for underlying diseases you may have. SEEK MEDICAL CARE IF:   Your condition is not improving after your  transfusion.  You develop redness or irritation at the intravenous (IV) site. SEEK IMMEDIATE MEDICAL CARE IF:  Any of the following symptoms occur over the next 12 hours:  Shaking chills.  You have a temperature by mouth above 102 F (38.9 C), not controlled by medicine.  Chest, back, or muscle pain.  People around you feel you are not acting correctly or are confused.  Shortness of breath or difficulty breathing.  Dizziness and fainting.  You get a rash or develop hives.  You have a decrease in urine output.  Your urine turns a dark color or changes to pink, red, or brown. Any of the following symptoms occur over the next 10 days:  You have a temperature by mouth above 102 F (38.9 C), not controlled by medicine.  Shortness of breath.  Weakness after normal activity.  The white part of the eye turns yellow (jaundice).  You have a decrease in the amount of urine or are urinating less often.  Your urine turns a dark color or changes to pink, red, or brown. Document Released: 08/20/2000 Document Revised: 11/15/2011 Document Reviewed: 04/08/2008 [facility] Patient Information 2014 [place], [place].  _______________________________________________________________________

## 2015-08-12 NOTE — Progress Notes (Addendum)
07-03-15 - Lexiscan Myoview - with cardiac clearance - in chart 06-06-15 - LOV - Dr. Donnie Ahoilley (cardio) - EPIC 06-02-15 - LOV - Dr. Maricela CuretLavenstein (fam.med.) - EPIC 05-19-15 - EKG and CTA chest - in chart

## 2015-08-17 NOTE — H&P (Signed)
TOTAL HIP ADMISSION H&P  Patient is admitted for left total hip arthroplasty, anterior approach.  Subjective:  Chief Complaint:    Left hip primary OA / pain  HPI: Melinda Washington, 74 y.o. female, has a history of pain and functional disability in the left hip(s) due to arthritis and patient has failed non-surgical conservative treatments for greater than 12 weeks to include NSAID's and/or analgesics, use of assistive devices and activity modification.  Onset of symptoms was gradual starting ~4 years ago with gradually worsening course since that time.The patient noted no past surgery on the left hip(s).  Patient currently rates pain in the left hip at 9 out of 10 with activity. Patient has worsening of pain with activity and weight bearing, trendelenberg gait, pain that interfers with activities of daily living and pain with passive range of motion. Patient has evidence of periarticular osteophytes and joint space narrowing by imaging studies. This condition presents safety issues increasing the risk of falls.  There is no current active infection.  Risks, benefits and expectations were discussed with the patient.  Risks including but not limited to the risk of anesthesia, blood clots, nerve damage, blood vessel damage, failure of the prosthesis, infection and up to and including death.  Patient understand the risks, benefits and expectations and wishes to proceed with surgery.   PCP: Elvina Sidle, MD  D/C Plans:      SNF  Post-op Meds:       No Rx given  Tranexamic Acid:      To be given - IV  Decadron:      Is to be given  FYI:     ASA post-op  Tramadol & APAP    Patient Active Problem List   Diagnosis Date Noted  . Migraine 08/31/2012   Past Medical History  Diagnosis Date  . Diverticulitis   . Hypertension   . Anxiety   . Headache     migraines  . Arthritis     Past Surgical History  Procedure Laterality Date  . Hernia repair    . Abdominal hysterectomy      Partial   . Carpal tunnel release Right   . Bunionectomy Left   . Abdominal surgery due to barium enema prep - enlarged intestines which resulted in hernias.  has mesh in abdomen from incisional hernias    . Tonsillectomy      No prescriptions prior to admission   Allergies  Allergen Reactions  . Sulfamethoxazole Rash  . Ciprofloxacin     Pt says it makes her feeling "very weak."   . Codeine Nausea And Vomiting    All pain medications cause some degree of nausea (moderate to severe)- able to tolerate when premedicated with antiemetic  . Ketorolac Tromethamine Anxiety    Tremors, teeth chattering  . Latex Rash    Redness & burning.  Marland Kitchen Penicillins Rash    Has patient had a PCN reaction causing immediate rash, facial/tongue/throat swelling, SOB or lightheadedness with hypotension: No Has patient had a PCN reaction causing severe rash involving mucus membranes or skin necrosis: No Has patient had a PCN reaction that required hospitalization No Has patient had a PCN reaction occurring within the last 10 years: No If all of the above answers are "NO", then may proceed with Cephalosporin use.     Social History  Substance Use Topics  . Smoking status: Never Smoker   . Smokeless tobacco: Never Used  . Alcohol Use: 0.0 oz/week    0  Standard drinks or equivalent per week     Comment: rarely       Review of Systems  Constitutional: Negative.   Eyes: Negative.   Respiratory: Negative.   Cardiovascular: Negative.   Gastrointestinal: Negative.   Genitourinary: Negative.   Musculoskeletal: Positive for joint pain.  Skin: Negative.   Neurological: Positive for headaches.  Endo/Heme/Allergies: Negative.   Psychiatric/Behavioral: The patient is nervous/anxious.     Objective:  Physical Exam  Constitutional: She is oriented to person, place, and time. She appears well-developed.  HENT:  Head: Normocephalic.  Eyes: Pupils are equal, round, and reactive to light.  Neck: Neck supple. No  JVD present. No tracheal deviation present. No thyromegaly present.  Cardiovascular: Normal rate, regular rhythm, normal heart sounds and intact distal pulses.   Respiratory: Effort normal and breath sounds normal. No stridor. No respiratory distress. She has no wheezes.  GI: Soft. There is no tenderness. There is no guarding.  Musculoskeletal:       Left hip: She exhibits decreased range of motion, decreased strength, tenderness and bony tenderness. She exhibits no swelling, no deformity and no laceration.  Lymphadenopathy:    She has no cervical adenopathy.  Neurological: She is alert and oriented to person, place, and time.  Skin: Skin is warm and dry.  Psychiatric: She has a normal mood and affect.      Labs:  Estimated body mass index is 24.41 kg/(m^2) as calculated from the following:   Height as of 06/02/15: 5' 3.5" (1.613 m).   Weight as of 06/02/15: 63.504 kg (140 lb).   Imaging Review Plain radiographs demonstrate severe degenerative joint disease of the left hip(s). The bone quality appears to be good for age and reported activity level.  Assessment/Plan:  End stage arthritis, left hip(s)  The patient history, physical examination, clinical judgement of the provider and imaging studies are consistent with end stage degenerative joint disease of the left hip(s) and total hip arthroplasty is deemed medically necessary. The treatment options including medical management, injection therapy, arthroscopy and arthroplasty were discussed at length. The risks and benefits of total hip arthroplasty were presented and reviewed. The risks due to aseptic loosening, infection, stiffness, dislocation/subluxation,  thromboembolic complications and other imponderables were discussed.  The patient acknowledged the explanation, agreed to proceed with the plan and consent was signed. Patient is being admitted for inpatient treatment for surgery, pain control, PT, OT, prophylactic antibiotics, VTE  prophylaxis, progressive ambulation and ADL's and discharge planning.The patient is planning to be discharged to skilled nursing facility.        Anastasio AuerbachMatthew S. Lavanna Rog   PA-C  08/17/2015, 11:31 AM

## 2015-08-19 ENCOUNTER — Encounter (HOSPITAL_COMMUNITY)
Admission: RE | Disposition: A | Discharge status: Skilled Nursing Facility | Payer: Self-pay | Source: Ambulatory Visit | Attending: Orthopedic Surgery

## 2015-08-19 ENCOUNTER — Inpatient Hospital Stay (HOSPITAL_COMMUNITY): Payer: Medicare Other

## 2015-08-19 ENCOUNTER — Inpatient Hospital Stay (HOSPITAL_COMMUNITY): Payer: Medicare Other | Admitting: Anesthesiology

## 2015-08-19 ENCOUNTER — Encounter (HOSPITAL_COMMUNITY): Payer: Self-pay | Admitting: *Deleted

## 2015-08-19 ENCOUNTER — Inpatient Hospital Stay (HOSPITAL_COMMUNITY)
Admission: RE | Admit: 2015-08-19 | Discharge: 2015-08-21 | DRG: 470 | Disposition: A | Discharge status: Skilled Nursing Facility | Payer: Medicare Other | Source: Ambulatory Visit | Attending: Orthopedic Surgery | Admitting: Orthopedic Surgery

## 2015-08-19 DIAGNOSIS — E663 Overweight: Secondary | ICD-10-CM | POA: Diagnosis not present

## 2015-08-19 DIAGNOSIS — F419 Anxiety disorder, unspecified: Secondary | ICD-10-CM | POA: Diagnosis present

## 2015-08-19 DIAGNOSIS — M169 Osteoarthritis of hip, unspecified: Secondary | ICD-10-CM | POA: Diagnosis not present

## 2015-08-19 DIAGNOSIS — Z96649 Presence of unspecified artificial hip joint: Secondary | ICD-10-CM

## 2015-08-19 DIAGNOSIS — Z96642 Presence of left artificial hip joint: Secondary | ICD-10-CM | POA: Diagnosis not present

## 2015-08-19 DIAGNOSIS — M1612 Unilateral primary osteoarthritis, left hip: Secondary | ICD-10-CM | POA: Diagnosis not present

## 2015-08-19 DIAGNOSIS — Z471 Aftercare following joint replacement surgery: Secondary | ICD-10-CM | POA: Diagnosis not present

## 2015-08-19 DIAGNOSIS — Z01812 Encounter for preprocedural laboratory examination: Secondary | ICD-10-CM

## 2015-08-19 DIAGNOSIS — R2681 Unsteadiness on feet: Secondary | ICD-10-CM | POA: Diagnosis not present

## 2015-08-19 DIAGNOSIS — Z6825 Body mass index (BMI) 25.0-25.9, adult: Secondary | ICD-10-CM

## 2015-08-19 DIAGNOSIS — M25552 Pain in left hip: Secondary | ICD-10-CM | POA: Diagnosis not present

## 2015-08-19 DIAGNOSIS — I10 Essential (primary) hypertension: Secondary | ICD-10-CM | POA: Diagnosis present

## 2015-08-19 DIAGNOSIS — M6281 Muscle weakness (generalized): Secondary | ICD-10-CM | POA: Diagnosis not present

## 2015-08-19 DIAGNOSIS — F411 Generalized anxiety disorder: Secondary | ICD-10-CM

## 2015-08-19 HISTORY — PX: TOTAL HIP ARTHROPLASTY: SHX124

## 2015-08-19 LAB — TYPE AND SCREEN
ABO/RH(D): O POS
Antibody Screen: NEGATIVE

## 2015-08-19 SURGERY — ARTHROPLASTY, HIP, TOTAL, ANTERIOR APPROACH
Anesthesia: Spinal | Site: Hip | Laterality: Left

## 2015-08-19 MED ORDER — ASPIRIN EC 325 MG PO TBEC
325.0000 mg | DELAYED_RELEASE_TABLET | Freq: Two times a day (BID) | ORAL | Status: DC
  Administered 2015-08-20 – 2015-08-21 (×3): 325 mg via ORAL
  Filled 2015-08-19 (×5): qty 1

## 2015-08-19 MED ORDER — BUPIVACAINE HCL (PF) 0.5 % IJ SOLN
INTRAMUSCULAR | Status: DC | PRN
  Administered 2015-08-19: 3 mL

## 2015-08-19 MED ORDER — LIDOCAINE HCL (CARDIAC) 20 MG/ML IV SOLN
INTRAVENOUS | Status: AC
  Filled 2015-08-19: qty 5

## 2015-08-19 MED ORDER — CEFAZOLIN SODIUM-DEXTROSE 2-3 GM-% IV SOLR
2.0000 g | Freq: Four times a day (QID) | INTRAVENOUS | Status: AC
Start: 2015-08-19 — End: 2015-08-20
  Administered 2015-08-19 – 2015-08-20 (×2): 2 g via INTRAVENOUS
  Filled 2015-08-19 (×2): qty 50

## 2015-08-19 MED ORDER — SODIUM CHLORIDE 0.9 % IR SOLN
Status: DC | PRN
  Administered 2015-08-19: 1000 mL

## 2015-08-19 MED ORDER — METOCLOPRAMIDE HCL 5 MG/ML IJ SOLN: 5.0000 mg | Freq: Three times a day (TID) | INTRAMUSCULAR | Status: DC | PRN

## 2015-08-19 MED ORDER — DOCUSATE SODIUM 100 MG PO CAPS
100.0000 mg | ORAL_CAPSULE | Freq: Two times a day (BID) | ORAL | Status: DC
  Administered 2015-08-20: 100 mg via ORAL

## 2015-08-19 MED ORDER — ALPRAZOLAM 0.25 MG PO TABS: 0.2500 mg | ORAL_TABLET | Freq: Three times a day (TID) | ORAL | Status: DC | PRN

## 2015-08-19 MED ORDER — SUMATRIPTAN SUCCINATE 100 MG PO TABS: 100.0000 mg | ORAL_TABLET | ORAL | Status: DC | PRN

## 2015-08-19 MED ORDER — TRAMADOL HCL 50 MG PO TABS
50.0000 mg | ORAL_TABLET | Freq: Four times a day (QID) | ORAL | Status: DC
  Administered 2015-08-19 – 2015-08-21 (×6): 100 mg via ORAL
  Administered 2015-08-21: 50 mg via ORAL
  Filled 2015-08-19 (×6): qty 2
  Filled 2015-08-19: qty 1

## 2015-08-19 MED ORDER — ALUM & MAG HYDROXIDE-SIMETH 200-200-20 MG/5ML PO SUSP: 30.0000 mL | ORAL | Status: DC | PRN

## 2015-08-19 MED ORDER — SODIUM CHLORIDE 0.9 % IV SOLN
100.0000 mL/h | INTRAVENOUS | Status: DC
  Administered 2015-08-19 – 2015-08-20 (×2): 100 mL/h via INTRAVENOUS
  Filled 2015-08-19 (×7): qty 1000

## 2015-08-19 MED ORDER — HYDROMORPHONE HCL 1 MG/ML IJ SOLN
INTRAMUSCULAR | Status: AC
  Filled 2015-08-19: qty 1

## 2015-08-19 MED ORDER — ONDANSETRON HCL 4 MG PO TABS: 4.0000 mg | ORAL_TABLET | Freq: Four times a day (QID) | ORAL | Status: DC | PRN

## 2015-08-19 MED ORDER — DIPHENHYDRAMINE HCL 25 MG PO CAPS: 25.0000 mg | ORAL_CAPSULE | Freq: Four times a day (QID) | ORAL | Status: DC | PRN

## 2015-08-19 MED ORDER — LACTATED RINGERS IV SOLN: INTRAVENOUS | Status: DC

## 2015-08-19 MED ORDER — BISACODYL 10 MG RE SUPP: 10.0000 mg | Freq: Every day | RECTAL | Status: DC | PRN

## 2015-08-19 MED ORDER — TRANEXAMIC ACID 1000 MG/10ML IV SOLN
1000.0000 mg | Freq: Once | INTRAVENOUS | Status: AC
  Administered 2015-08-19: 1000 mg via INTRAVENOUS
  Filled 2015-08-19: qty 10

## 2015-08-19 MED ORDER — HYDROMORPHONE HCL 1 MG/ML IJ SOLN: 0.2500 mg | INTRAMUSCULAR | Status: DC | PRN

## 2015-08-19 MED ORDER — METOCLOPRAMIDE HCL 10 MG PO TABS: 5.0000 mg | ORAL_TABLET | Freq: Three times a day (TID) | ORAL | Status: DC | PRN

## 2015-08-19 MED ORDER — ONDANSETRON HCL 4 MG/2ML IJ SOLN
INTRAMUSCULAR | Status: DC | PRN
  Administered 2015-08-19: 4 mg via INTRAVENOUS

## 2015-08-19 MED ORDER — DEXAMETHASONE SODIUM PHOSPHATE 10 MG/ML IJ SOLN
INTRAMUSCULAR | Status: AC
  Filled 2015-08-19: qty 1

## 2015-08-19 MED ORDER — CEFAZOLIN SODIUM-DEXTROSE 2-3 GM-% IV SOLR
2.0000 g | INTRAVENOUS | Status: AC
  Administered 2015-08-19: 2 g via INTRAVENOUS

## 2015-08-19 MED ORDER — POLYETHYLENE GLYCOL 3350 17 G PO PACK
17.0000 g | PACK | Freq: Two times a day (BID) | ORAL | Status: DC
  Administered 2015-08-20: 17 g via ORAL

## 2015-08-19 MED ORDER — BUPIVACAINE HCL (PF) 0.5 % IJ SOLN
INTRAMUSCULAR | Status: AC
  Filled 2015-08-19: qty 30

## 2015-08-19 MED ORDER — PROPOFOL 10 MG/ML IV BOLUS
INTRAVENOUS | Status: AC
  Filled 2015-08-19: qty 20

## 2015-08-19 MED ORDER — FENTANYL CITRATE (PF) 100 MCG/2ML IJ SOLN
INTRAMUSCULAR | Status: AC
  Filled 2015-08-19: qty 2

## 2015-08-19 MED ORDER — DEXAMETHASONE SODIUM PHOSPHATE 10 MG/ML IJ SOLN
10.0000 mg | Freq: Once | INTRAMUSCULAR | Status: AC
  Administered 2015-08-19: 10 mg via INTRAVENOUS

## 2015-08-19 MED ORDER — PHENOL 1.4 % MT LIQD: 1.0000 | OROMUCOSAL | Status: DC | PRN

## 2015-08-19 MED ORDER — HYDROMORPHONE HCL 1 MG/ML IJ SOLN
0.5000 mg | INTRAMUSCULAR | Status: DC | PRN
  Administered 2015-08-19: 0.5 mg via INTRAVENOUS
  Filled 2015-08-19: qty 1

## 2015-08-19 MED ORDER — ONDANSETRON HCL 4 MG/2ML IJ SOLN
4.0000 mg | Freq: Four times a day (QID) | INTRAMUSCULAR | Status: DC | PRN
  Administered 2015-08-19 – 2015-08-21 (×3): 4 mg via INTRAVENOUS
  Filled 2015-08-19 (×3): qty 2

## 2015-08-19 MED ORDER — DEXAMETHASONE SODIUM PHOSPHATE 10 MG/ML IJ SOLN
10.0000 mg | Freq: Once | INTRAMUSCULAR | Status: AC
  Administered 2015-08-20: 10 mg via INTRAVENOUS
  Filled 2015-08-19: qty 1

## 2015-08-19 MED ORDER — PROMETHAZINE HCL 25 MG/ML IJ SOLN
6.2500 mg | INTRAMUSCULAR | Status: DC | PRN
  Administered 2015-08-19: 6.25 mg via INTRAVENOUS

## 2015-08-19 MED ORDER — LIDOCAINE HCL (CARDIAC) 20 MG/ML IV SOLN
INTRAVENOUS | Status: DC | PRN
  Administered 2015-08-19: 50 mg via INTRAVENOUS

## 2015-08-19 MED ORDER — ACETAMINOPHEN 500 MG PO TABS
1000.0000 mg | ORAL_TABLET | Freq: Three times a day (TID) | ORAL | Status: DC
  Administered 2015-08-19 – 2015-08-21 (×6): 1000 mg via ORAL
  Filled 2015-08-19 (×11): qty 2

## 2015-08-19 MED ORDER — METHOCARBAMOL 1000 MG/10ML IJ SOLN
500.0000 mg | Freq: Four times a day (QID) | INTRAVENOUS | Status: DC | PRN
  Filled 2015-08-19: qty 5

## 2015-08-19 MED ORDER — LACTATED RINGERS IV SOLN
INTRAVENOUS | Status: DC | PRN
  Administered 2015-08-19 (×3): via INTRAVENOUS

## 2015-08-19 MED ORDER — MIDAZOLAM HCL 2 MG/2ML IJ SOLN
INTRAMUSCULAR | Status: AC
  Filled 2015-08-19: qty 2

## 2015-08-19 MED ORDER — PROPOFOL 500 MG/50ML IV EMUL
INTRAVENOUS | Status: DC | PRN
  Administered 2015-08-19: 75 ug/kg/min via INTRAVENOUS

## 2015-08-19 MED ORDER — FERROUS SULFATE 325 (65 FE) MG PO TABS
325.0000 mg | ORAL_TABLET | Freq: Three times a day (TID) | ORAL | Status: DC
  Administered 2015-08-20 (×2): 325 mg via ORAL
  Filled 2015-08-19 (×8): qty 1

## 2015-08-19 MED ORDER — MAGNESIUM CITRATE PO SOLN: 1.0000 | Freq: Once | ORAL | Status: DC | PRN

## 2015-08-19 MED ORDER — CEFAZOLIN SODIUM-DEXTROSE 2-3 GM-% IV SOLR
INTRAVENOUS | Status: AC
  Filled 2015-08-19: qty 50

## 2015-08-19 MED ORDER — METHOCARBAMOL 500 MG PO TABS
500.0000 mg | ORAL_TABLET | Freq: Four times a day (QID) | ORAL | Status: DC | PRN
  Filled 2015-08-19: qty 1

## 2015-08-19 MED ORDER — MIDAZOLAM HCL 5 MG/5ML IJ SOLN
INTRAMUSCULAR | Status: DC | PRN
  Administered 2015-08-19: 2 mg via INTRAVENOUS

## 2015-08-19 MED ORDER — MENTHOL 3 MG MT LOZG: 1.0000 | LOZENGE | OROMUCOSAL | Status: DC | PRN

## 2015-08-19 MED ORDER — FENTANYL CITRATE (PF) 100 MCG/2ML IJ SOLN
INTRAMUSCULAR | Status: DC | PRN
  Administered 2015-08-19: 100 ug via INTRAVENOUS

## 2015-08-19 MED ORDER — CHLORHEXIDINE GLUCONATE 4 % EX LIQD: 60.0000 mL | Freq: Once | CUTANEOUS | Status: DC

## 2015-08-19 MED ORDER — PROMETHAZINE HCL 25 MG/ML IJ SOLN
INTRAMUSCULAR | Status: AC
Start: 2015-08-19 — End: 2015-08-20
  Filled 2015-08-19: qty 1

## 2015-08-19 MED ORDER — LOSARTAN POTASSIUM 50 MG PO TABS
100.0000 mg | ORAL_TABLET | Freq: Every day | ORAL | Status: DC
  Administered 2015-08-21: 100 mg via ORAL
  Filled 2015-08-19 (×3): qty 2

## 2015-08-19 MED ORDER — ONDANSETRON HCL 4 MG/2ML IJ SOLN
INTRAMUSCULAR | Status: AC
  Filled 2015-08-19: qty 2

## 2015-08-19 SURGICAL SUPPLY — 33 items
BAG DECANTER FOR FLEXI CONT (MISCELLANEOUS) IMPLANT
BAG ZIPLOCK 12X15 (MISCELLANEOUS) IMPLANT
CAPT HIP TOTAL 2 ×3 IMPLANT
CLOTH BEACON ORANGE TIMEOUT ST (SAFETY) ×3 IMPLANT
COVER PERINEAL POST (MISCELLANEOUS) ×3 IMPLANT
DRAPE STERI IOBAN 125X83 (DRAPES) ×3 IMPLANT
DRAPE U-SHAPE 47X51 STRL (DRAPES) ×6 IMPLANT
DRSG AQUACEL AG ADV 3.5X10 (GAUZE/BANDAGES/DRESSINGS) ×3 IMPLANT
DURAPREP 26ML APPLICATOR (WOUND CARE) ×3 IMPLANT
ELECT REM PT RETURN 15FT ADLT (MISCELLANEOUS) IMPLANT
ELECT REM PT RETURN 9FT ADLT (ELECTROSURGICAL) ×3
ELECTRODE REM PT RTRN 9FT ADLT (ELECTROSURGICAL) ×1 IMPLANT
GLOVE BIOGEL M 7.0 STRL (GLOVE) IMPLANT
GLOVE BIOGEL PI IND STRL 7.5 (GLOVE) ×1 IMPLANT
GLOVE BIOGEL PI IND STRL 8.5 (GLOVE) ×1 IMPLANT
GLOVE BIOGEL PI INDICATOR 7.5 (GLOVE) ×2
GLOVE BIOGEL PI INDICATOR 8.5 (GLOVE) ×2
GLOVE ECLIPSE 8.0 STRL XLNG CF (GLOVE) ×6 IMPLANT
GLOVE ORTHO TXT STRL SZ7.5 (GLOVE) ×3 IMPLANT
GOWN STRL REUS W/TWL LRG LVL3 (GOWN DISPOSABLE) ×3 IMPLANT
GOWN STRL REUS W/TWL XL LVL3 (GOWN DISPOSABLE) ×3 IMPLANT
HOLDER FOLEY CATH W/STRAP (MISCELLANEOUS) ×3 IMPLANT
LIQUID BAND (GAUZE/BANDAGES/DRESSINGS) ×3 IMPLANT
PACK ANTERIOR HIP CUSTOM (KITS) ×3 IMPLANT
SAW OSC TIP CART 19.5X105X1.3 (SAW) ×3 IMPLANT
SUT MNCRL AB 4-0 PS2 18 (SUTURE) ×3 IMPLANT
SUT VIC AB 1 CT1 36 (SUTURE) ×9 IMPLANT
SUT VIC AB 2-0 CT1 27 (SUTURE) ×4
SUT VIC AB 2-0 CT1 TAPERPNT 27 (SUTURE) ×2 IMPLANT
SUT VLOC 180 0 24IN GS25 (SUTURE) ×3 IMPLANT
TRAY FOLEY W/METER SILVER 14FR (SET/KITS/TRAYS/PACK) IMPLANT
TRAY FOLEY W/METER SILVER 16FR (SET/KITS/TRAYS/PACK) IMPLANT
WATER STERILE IRR 1500ML POUR (IV SOLUTION) ×3 IMPLANT

## 2015-08-19 NOTE — Anesthesia Preprocedure Evaluation (Addendum)
Anesthesia Evaluation  Patient identified by MRN, date of birth, ID band Patient awake    Reviewed: Allergy & Precautions, NPO status , Patient's Chart, lab work & pertinent test results  Airway Mallampati: II  TM Distance: >3 FB Neck ROM: Full    Dental no notable dental hx.    Pulmonary neg pulmonary ROS,    Pulmonary exam normal breath sounds clear to auscultation       Cardiovascular hypertension, Pt. on medications Normal cardiovascular exam Rhythm:Regular Rate:Normal     Neuro/Psych  Headaches, Anxiety    GI/Hepatic negative GI ROS, Neg liver ROS,   Endo/Other  negative endocrine ROS  Renal/GU negative Renal ROS  negative genitourinary   Musculoskeletal  (+) Arthritis ,   Abdominal   Peds negative pediatric ROS (+)  Hematology negative hematology ROS (+)   Anesthesia Other Findings   Reproductive/Obstetrics negative OB ROS                            Anesthesia Physical Anesthesia Plan  ASA: II  Anesthesia Plan: Spinal   Post-op Pain Management:    Induction: Intravenous  Airway Management Planned: Natural Airway  Additional Equipment:   Intra-op Plan:   Post-operative Plan:   Informed Consent: I have reviewed the patients History and Physical, chart, labs and discussed the procedure including the risks, benefits and alternatives for the proposed anesthesia with the patient or authorized representative who has indicated his/her understanding and acceptance.   Dental advisory given  Plan Discussed with: CRNA  Anesthesia Plan Comments: (Discussed risks and benefits of and differences between spinal and general. Discussed risks of spinal including headache, backache, failure, bleeding and hematoma, infection, and nerve damage. Patient consents to spinal. Questions answered. Coagulation studies and platelet count acceptable.)       Anesthesia Quick Evaluation

## 2015-08-19 NOTE — Discharge Instructions (Signed)

## 2015-08-19 NOTE — Anesthesia Postprocedure Evaluation (Signed)
Anesthesia Post Note  Patient: Melinda Washington  Procedure(s) Performed: Procedure(s) (LRB): TOTAL HIP ARTHROPLASTY ANTERIOR APPROACH (Left)  Patient location during evaluation: PACU Anesthesia Type: Spinal Level of consciousness: oriented and awake and alert Pain management: pain level controlled Vital Signs Assessment: post-procedure vital signs reviewed and stable Respiratory status: spontaneous breathing, respiratory function stable and patient connected to nasal cannula oxygen Cardiovascular status: blood pressure returned to baseline and stable Postop Assessment: no headache, no backache and spinal receding Anesthetic complications: no    Last Vitals:  Filed Vitals:   08/19/15 1816 08/19/15 1915  BP: 126/83 121/86  Pulse: 71 79  Temp: 36.1 C 36.3 C  Resp: 16 14    Last Pain:  Filed Vitals:   08/19/15 1937  PainSc: 0-No pain                 Andriy Sherk J

## 2015-08-19 NOTE — Interval H&P Note (Signed)
History and Physical Interval Note:  08/19/2015 11:51 AM  Melinda Washington  has presented today for surgery, with the diagnosis of LEFT HIP OA  The various methods of treatment have been discussed with the patient and family. After consideration of risks, benefits and other options for treatment, the patient has consented to  Procedure(s): TOTAL HIP ARTHROPLASTY ANTERIOR APPROACH (Left) as a surgical intervention .  The patient's history has been reviewed, patient examined, no change in status, stable for surgery.  I have reviewed the patient's chart and labs.  Questions were answered to the patient's satisfaction.     Shelda PalLIN,Zachary Lovins D

## 2015-08-19 NOTE — Progress Notes (Signed)
Utilization review completed.  

## 2015-08-19 NOTE — Anesthesia Procedure Notes (Signed)
Spinal Patient location during procedure: OR End time: 08/19/2015 1:22 PM Staffing Resident/CRNA: WILLIFORD, PEGGY D Performed by: anesthesiologist and resident/CRNA  Preanesthetic Checklist Completed: patient identified, site marked, surgical consent, pre-op evaluation, timeout performed, IV checked, risks and benefits discussed and monitors and equipment checked Spinal Block Patient position: sitting Prep: Betadine Patient monitoring: heart rate, continuous pulse ox and blood pressure Approach: right paramedian Location: L2-3 Injection technique: single-shot Needle Needle type: Sprotte  Needle gauge: 24 G Needle length: 9 cm Assessment Sensory level: T6 Additional Notes Expiration date of kit checked and confirmed. Patient tolerated procedure well, without complications.     

## 2015-08-19 NOTE — Transfer of Care (Signed)
Immediate Anesthesia Transfer of Care Note  Patient: Melinda Washington  Procedure(s) Performed: Procedure(s): TOTAL HIP ARTHROPLASTY ANTERIOR APPROACH (Left)  Patient Location: PACU  Anesthesia Type:Spinal  Level of Consciousness:  sedated, patient cooperative and responds to stimulation  Airway & Oxygen Therapy:Patient Spontanous Breathing and Patient connected to face mask oxgen  Post-op Assessment:  Report given to PACU RN and Post -op Vital signs reviewed and stable  Post vital signs:  Reviewed and stable  Last Vitals:  Filed Vitals:   08/19/15 1013  BP: 135/98  Pulse: 86  Temp: 36.4 C  Resp: 16    Complications: No apparent anesthesia complications

## 2015-08-19 NOTE — Op Note (Signed)
NAME:  Melinda SawyersRosemarie W Washington                ACCOUNT NO.: [id number]      MEDICAL RECORD NO.: [id number]      [facility]      PHYSICIAN:  Durene RomansLIN,Prisila Dlouhy D  DATE OF BIRTH:  07/20/1941     DATE OF PROCEDURE:  08/19/2015                                 OPERATIVE REPORT         PREOPERATIVE DIAGNOSIS: Left  hip osteoarthritis.      POSTOPERATIVE DIAGNOSIS:  Left hip osteoarthritis.      PROCEDURE:  Left total hip replacement through an anterior approach   utilizing DePuy THR system, component size 52mm pinnacle cup, a size 36+4 neutral   Altrex liner, a size 8 Hi Tri Lock stem with a 36+1.5 delta ceramic   ball.      SURGEON:  Madlyn FrankelMatthew D. Charlann Boxerlin, M.D.      ASSISTANT:  Lanney GinsMatthew Babish, PA-C      ANESTHESIA:  Spinal.      SPECIMENS:  None.      COMPLICATIONS:  None.      BLOOD LOSS:  300 cc     DRAINS:  None.      INDICATION OF THE PROCEDURE:  Melinda SawyersRosemarie W Nodal is a 74 y.o. female who had   presented to office for evaluation of left hip pain.  Radiographs revealed   progressive degenerative changes with bone-on-bone   articulation to the  hip joint.  The patient had painful limited range of   motion significantly affecting their overall quality of life.  The patient was failing to    respond to conservative measures, and at this point was ready   to proceed with more definitive measures.  The patient has noted progressive   degenerative changes in his hip, progressive problems and dysfunction   with regarding the hip prior to surgery.  Consent was obtained for   benefit of pain relief.  Specific risk of infection, DVT, component   failure, dislocation, need for revision surgery, as well discussion of   the anterior versus posterior approach were reviewed.  Consent was   obtained for benefit of anterior pain relief through an anterior   approach.      PROCEDURE IN DETAIL:  The patient was brought to operative theater.   Once adequate anesthesia,  preoperative antibiotics, 2gm of Ancef, 1 gm of Tranexamic Acid, and 10 mg of Decadron administered.   The patient was positioned supine on the OSI Hanna table.  Once adequate   padding of boney process was carried out, we had predraped out the hip, and  used fluoroscopy to confirm orientation of the pelvis and position.      The left hip was then prepped and draped from proximal iliac crest to   mid thigh with shower curtain technique.      Time-out was performed identifying the patient, planned procedure, and   extremity.     An incision was then made 2 cm distal and lateral to the   anterior superior iliac spine extending over the orientation of the   tensor fascia lata muscle and sharp dissection was carried down to the   fascia of the muscle and protractor placed in the soft tissues.      The fascia  was then incised.  The muscle belly was identified and swept   laterally and retractor placed along the superior neck.  Following   cauterization of the circumflex vessels and removing some pericapsular   fat, a second cobra retractor was placed on the inferior neck.  A third   retractor was placed on the anterior acetabulum after elevating the   anterior rectus.  A L-capsulotomy was along the line of the   superior neck to the trochanteric fossa, then extended proximally and   distally.  Tag sutures were placed and the retractors were then placed   intracapsular.  We then identified the trochanteric fossa and   orientation of my neck cut, confirmed this radiographically   and then made a neck osteotomy with the femur on traction.  The femoral   head was removed without difficulty or complication.  Traction was let   off and retractors were placed posterior and anterior around the   acetabulum.      The labrum and foveal tissue were debrided.  I began reaming with a 47mm   reamer and reamed up to 51mm reamer with good bony bed preparation and a 52mm   cup was chosen.  The final 52mm  Pinnacle cup was then impacted under fluoroscopy  to confirm the depth of penetration and orientation with respect to   abduction.  A screw was placed followed by the hole eliminator.  The final   36+4 neutral Altrex liner was impacted with good visualized rim fit.  The cup was positioned anatomically within the acetabular portion of the pelvis.      At this point, the femur was rolled at 80 degrees.  Further capsule was   released off the inferior aspect of the femoral neck.  I then   released the superior capsule proximally.  The hook was placed laterally   along the femur and elevated manually and held in position with the bed   hook.  The leg was then extended and adducted with the leg rolled to 100   degrees of external rotation.  Once the proximal femur was fully   exposed, I used a box osteotome to set orientation.  I then began   broaching with the starting chili pepper broach and passed this by hand and then broached up to 8.  With the 8 broach in place I chose a high offset neck and did a trial reduction.  The offset was appropriate, leg lengths   appeared to be restored and now equal compared to pre-operative condition as related to her left hip arthritis.   Given these findings, I went ahead and dislocated the hip, repositioned all   retractors and positioned the right hip in the extended and abducted position.  The final 8 Hi Tri Lock stem was   chosen and it was impacted down to the level of neck cut.  Based on this   and the trial reduction, a 36+1.5 delta ceramic ball was chosen and   impacted onto a clean and dry trunnion, and the hip was reduced.  The   hip had been irrigated throughout the case again at this point.  I did   reapproximate the superior capsular leaflet to the anterior leaflet   using #1 Vicryl.  The fascia of the   tensor fascia lata muscle was then reapproximated using #1 Vicryl and #0 V-lock sutures.  The   remaining wound was closed with 2-0 Vicryl and  running 4-0 Monocryl.  The hip was cleaned, dried, and dressed sterilely using Dermabond and   Aquacel dressing.  She was then brought   to recovery room in stable condition tolerating the procedure well.    Lanney Gins, PA-C was present for the entirety of the case involved from   preoperative positioning, perioperative retractor management, general   facilitation of the case, as well as primary wound closure as assistant.            Madlyn Frankel Charlann Boxer, M.D.        08/19/2015 2:51 PM

## 2015-08-20 ENCOUNTER — Encounter (HOSPITAL_COMMUNITY): Payer: Self-pay | Admitting: Orthopedic Surgery

## 2015-08-20 DIAGNOSIS — E663 Overweight: Secondary | ICD-10-CM | POA: Diagnosis present

## 2015-08-20 LAB — CBC
HCT: 27.8 % — ABNORMAL LOW (ref 36.0–46.0)
Hemoglobin: 9 g/dL — ABNORMAL LOW (ref 12.0–15.0)
MCH: 29.1 pg (ref 26.0–34.0)
MCHC: 32.4 g/dL (ref 30.0–36.0)
MCV: 90 fL (ref 78.0–100.0)
PLATELETS: 230 10*3/uL (ref 150–400)
RBC: 3.09 MIL/uL — AB (ref 3.87–5.11)
RDW: 13.2 % (ref 11.5–15.5)
WBC: 8.8 10*3/uL (ref 4.0–10.5)

## 2015-08-20 LAB — BASIC METABOLIC PANEL
Anion gap: 5 (ref 5–15)
BUN: 14 mg/dL (ref 6–20)
CALCIUM: 8.7 mg/dL — AB (ref 8.9–10.3)
CO2: 24 mmol/L (ref 22–32)
CREATININE: 0.79 mg/dL (ref 0.44–1.00)
Chloride: 108 mmol/L (ref 101–111)
GFR calc Af Amer: >60 mL/min (ref >60)
GLUCOSE: 160 mg/dL — AB (ref 65–99)
Potassium: 4 mmol/L (ref 3.5–5.1)
SODIUM: 137 mmol/L (ref 135–145)

## 2015-08-20 NOTE — Evaluation (Signed)
Physical Therapy Evaluation Patient Details Name: Melinda Washington MRN: [id number] DOB: Dec 19, 1940 Today's Date: 08/20/2015   History of Present Illness  L THR  Clinical Impression  Pt s/p L THR presents with decreased L LE strength/ROM and post op pain limiting functional mobility.  Pt would benefit from follow up rehab at SNF level to maximize IND and safety prior to return home with limited assist.    Follow Up Recommendations SNF    Equipment Recommendations  None recommended by PT    Recommendations for Other Services OT consult     Precautions / Restrictions Precautions Precautions: Fall Restrictions Weight Bearing Restrictions: No Other Position/Activity Restrictions: WBAT      Mobility  Bed Mobility Overal bed mobility: Needs Assistance Bed Mobility: Supine to Sit     Supine to sit: Min assist;Mod assist     General bed mobility comments: cues for sequence, posture and position from RW  Transfers Overall transfer level: Needs assistance Equipment used: Rolling walker (2 wheeled) Transfers: Sit to/from Stand Sit to Stand: Min assist;Mod assist         General transfer comment: cues for LE management and use of UEs to self assist  Ambulation/Gait Ambulation/Gait assistance: Min assist Ambulation Distance (Feet): 55 Feet Assistive device: Rolling walker (2 wheeled) Gait Pattern/deviations: Step-to pattern;Decreased step length - right;Decreased step length - left;Shuffle;Trunk flexed Gait velocity: decr   General Gait Details: cues for sequence, posture and position from [employer]            Wheelchair Mobility    Modified Rankin (Stroke Patients Only)       Balance                                             Pertinent Vitals/Pain Pain Assessment: 0-10 Pain Score: 6  Pain Location: L hip/thigh Pain Descriptors / Indicators: Aching;Sore Pain Intervention(s): Limited activity within patient's tolerance;Monitored  during session;Premedicated before session;Ice applied    Home Living Family/patient expects to be discharged to:: Skilled nursing facility Living Arrangements: Alone                    Prior Function Level of Independence: Independent;Independent with assistive device(s)               Hand Dominance        Extremity/Trunk Assessment   Upper Extremity Assessment: Overall WFL for tasks assessed           Lower Extremity Assessment: LLE deficits/detail   LLE Deficits / Details: Srength at hip 2/5 with AAROM at hip to 80 flex and 15 abd     Communication   Communication: No difficulties  Cognition Arousal/Alertness: Awake/alert Behavior During Therapy: WFL for tasks assessed/performed Overall Cognitive Status: Within Functional Limits for tasks assessed                      General Comments      Exercises Total Joint Exercises Ankle Circles/Pumps: AROM;Both;15 reps;Supine Quad Sets: AROM;Both;10 reps;Supine Heel Slides: AAROM;Left;15 reps;Supine Hip ABduction/ADduction: AAROM;Left;10 reps;Supine      Assessment/Plan    PT Assessment Patient needs continued PT services  PT Diagnosis Difficulty walking   PT Problem List Decreased strength;Decreased range of motion;Decreased activity tolerance;Decreased mobility;Decreased knowledge of use of DME;Pain  PT Treatment Interventions DME instruction;Gait training;Stair training;Functional mobility training;Therapeutic activities;Therapeutic exercise;Patient/family education  PT Goals (Current goals can be found in the Care Plan section) Acute Rehab PT Goals Patient Stated Goal: Walk with a cane by the time I leave [place] PT Goal Formulation: With patient Time For Goal Achievement: 08/23/15 Potential to Achieve Goals: Good    Frequency 7X/week   Barriers to discharge        Co-evaluation               End of Session Equipment Utilized During Treatment: Gait belt Activity Tolerance:  Patient tolerated treatment well Patient left: in chair;with call bell/phone within reach;with chair alarm set Nurse Communication: Mobility status         Time: [id number] PT Time Calculation (min) (ACUTE ONLY): 38 min   Charges:   PT Evaluation $Initial PT Evaluation Tier I: 1 Procedure PT Treatments $Gait Training: 8-22 mins $Therapeutic Exercise: 8-22 mins   PT G Codes:        Javanni Maring 08/20/2015, 12:26 PM

## 2015-08-20 NOTE — Progress Notes (Signed)
Physical Therapy Treatment Patient Details Name: Melinda SawyersRosemarie W [place] MRN: [id number] DOB: [birth year] Today's Date: 08/20/2015    History of Present Illness L THR    PT Comments    Pt motivated and progressing well with mobility.  Follow Up Recommendations  SNF     Equipment Recommendations  None recommended by PT    Recommendations for Other Services OT consult     Precautions / Restrictions Precautions Precautions: Fall Restrictions Weight Bearing Restrictions: No Other Position/Activity Restrictions: WBAT    Mobility  Bed Mobility Overal bed mobility: Needs Assistance Bed Mobility: Supine to Sit;Sit to Supine     Supine to sit: Min assist Sit to supine: Min assist   General bed mobility comments: cues for sequence, posture and position from RW  Transfers Overall transfer level: Needs assistance Equipment used: Rolling walker (2 wheeled) Transfers: Sit to/from Stand Sit to Stand: Min assist         General transfer comment: cues for LE management and use of UEs to self assist  Ambulation/Gait Ambulation/Gait assistance: Min assist;Min guard Ambulation Distance (Feet): 111 Feet Assistive device: Rolling walker (2 wheeled) Gait Pattern/deviations: Step-to pattern;Step-through pattern;Decreased step length - right;Decreased step length - left;Shuffle;Trunk flexed Gait velocity: decr   General Gait Details: cues for sequence, posture and position from RW.  Pt vaulting over L LE and walking on toes on R, states L LE is longer    Stairs            Wheelchair Mobility    Modified Rankin (Stroke Patients Only)       Balance                                    Cognition Arousal/Alertness: Awake/alert Behavior During Therapy: WFL for tasks assessed/performed Overall Cognitive Status: Within Functional Limits for tasks assessed                      Exercises      General Comments        Pertinent Vitals/Pain Pain  Assessment: 0-10 Pain Score: 5  Pain Location: L hip/thigh Pain Descriptors / Indicators: Aching;Sore Pain Intervention(s): Limited activity within patient's tolerance;Monitored during session;Premedicated before session;Ice applied    Home Living                      Prior Function            PT Goals (current goals can now be found in the care plan section) Acute Rehab PT Goals Patient Stated Goal: Walk with a cane by the time I leave [place] PT Goal Formulation: With patient Time For Goal Achievement: 08/23/15 Potential to Achieve Goals: Good Progress towards PT goals: Progressing toward goals    Frequency  7X/week    PT Plan Current plan remains appropriate    Co-evaluation             End of Session Equipment Utilized During Treatment: Gait belt Activity Tolerance: Patient tolerated treatment well Patient left: in bed;with call bell/phone within reach     Time: 1532-1555 PT Time Calculation (min) (ACUTE ONLY): 23 min  Charges:  $Gait Training: 23-37 mins                    G Codes:      Melinda [place] 08/20/2015, 5:03 PM

## 2015-08-20 NOTE — Progress Notes (Signed)
OT Cancellation Note  Patient Details Name: Melinda [place] MRN: [id number] DOB: 1941/09/02   Cancelled Treatment:    Reason Eval/Treat Not Completed: OT screened, no needs identified, will sign off. Plan is for patient to discharge > SNF. No acute OT needs identified, all needs can be met in SNF. Please send text page to OT services if any questions, concerns, or with new orders: [phone] OR call office at [phone]. Thank you for the order.    Chrys Racer , MS, OTR/L, CLT Pager: [phone]  08/20/2015, 1:18 PM

## 2015-08-20 NOTE — Care Management Note (Addendum)
Case Management Note  Patient Details  Name: Melinda [place] MRN: [id number] Date of Birth: 1941/04/24  Subjective/Objective:   74 y.o. F admitted 08/19/2015 for L THA AA.    From Private residence where she lived alone prior to admit.   Mobility limited by pain.             Action/Plan: Anticipate discharge to SNF when medically ready, possibly 08/21/2015. No further CM needs but will be available should additional discharge needs arise.   Expected Discharge Date:                  Expected Discharge Plan:  Skilled Nursing Facility  In-House Referral:  Clinical Social Work  Discharge planning Services  CM Consult  Post Acute Care Choice:    Choice offered to:     DME Arranged:    DME Agency:     HH Arranged:    HH Agency:     Status of Service:  Completed, signed off  Medicare Important Message Given:    Date Medicare IM Given:    Medicare IM give by:    Date Additional Medicare IM Given:    Additional Medicare Important Message give by:     If discussed at Long Length of Stay Meetings, dates discussed:    Additional Comments:  Melinda [place], Melinda Bizzarro M, RN 08/20/2015, 9:46 AM

## 2015-08-20 NOTE — NC FL2 (Signed)
[place] MEDICAID FL2 LEVEL OF CARE SCREENING TOOL     IDENTIFICATION  Patient Name: Melinda Washington Birthdate: 1940-09-07 Sex: female Admission Date (Current Location): 08/19/2015  [facility] Hights and [place] Number:     Facility and Address:  [facility],  [address] [place] [address]      Provider Number: [id number]  Attending Physician Name and Address:  Durene Romans, MD  Relative Name and Phone Number:       Current Level of Care: Hospital Recommended Level of Care: Skilled Nursing Facility Prior Approval Number:    Date Approved/Denied:   PASRR Number:    Discharge Plan: SNF    Current Diagnoses: Patient Active Problem List   Diagnosis Date Noted  . 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 Migraine 08/31/2012    Orientation RESPIRATION BLADDER Height & Weight    Self, Time, Situation, Place  O2 (2 liters)    (160 cm) 145 lbs.  BEHAVIORAL SYMPTOMS/MOOD NEUROLOGICAL BOWEL NUTRITION STATUS        Diet (regular fluid consistency thin)  AMBULATORY STATUS COMMUNICATION OF NEEDS Skin     Verbally Surgical wounds                       Personal Care Assistance Level of Assistance              Functional Limitations Info             SPECIAL CARE FACTORS FREQUENCY                       Contractures      Additional Factors Info  Code Status, Allergies Code Status Info: full code Allergies Info: Sulfamethoxazole, Ciprofloxacin, Codeine, Ketorolac Tromethamine, Latex, Penicillins           Current Medications (08/20/2015):  This is the current hospital active medication list Current Facility-Administered Medications  Medication Dose Route Frequency Provider Last Rate Last Dose  . acetaminophen (TYLENOL) tablet 1,000 mg  1,000 mg Oral 3 times per day Lanney Gins, PA-C   1,000 mg at 08/19/15 2125  . ALPRAZolam Prudy Feeler) tablet 0.25 mg  0.25 mg Oral TID PRN Lanney Gins, PA-C      . alum & mag hydroxide-simeth  (MAALOX/MYLANTA) 200-200-20 MG/5ML suspension 30 mL  30 mL Oral Q4H PRN Lanney Gins, PA-C      . aspirin EC tablet 325 mg  325 mg Oral BID Lanney Gins, PA-C      . bisacodyl (DULCOLAX) suppository 10 mg  10 mg Rectal Daily PRN Lanney Gins, PA-C      . dexamethasone (DECADRON) injection 10 mg  10 mg Intravenous Once Lanney Gins, PA-C      . diphenhydrAMINE (BENADRYL) capsule 25 mg  25 mg Oral Q6H PRN Lanney Gins, PA-C      . docusate sodium (COLACE) capsule 100 mg  100 mg Oral BID Lanney Gins, PA-C   100 mg at 08/19/15 2347  . ferrous sulfate tablet 325 mg  325 mg Oral TID PC Lanney Gins, PA-C      . HYDROmorphone (DILAUDID) injection 0.5-1 mg  0.5-1 mg Intravenous Q2H PRN Lanney Gins, PA-C   0.5 mg at 08/19/15 2126  . losartan (COZAAR) tablet 100 mg  100 mg Oral Daily Lanney Gins, PA-C      . magnesium citrate solution 1 Bottle  1 Bottle Oral Once PRN Lanney Gins, PA-C      . menthol-cetylpyridinium (CEPACOL)  lozenge 3 mg  1 lozenge Oral PRN Lanney GinsMatthew Babish, PA-C       Or  . phenol (CHLORASEPTIC) mouth spray 1 spray  1 spray Mouth/Throat PRN Lanney GinsMatthew Babish, PA-C      . methocarbamol (ROBAXIN) tablet 500 mg  500 mg Oral Q6H PRN Lanney GinsMatthew Babish, PA-C   500 mg at 08/19/15 2341   Or  . methocarbamol (ROBAXIN) 500 mg in dextrose 5 % 50 mL IVPB  500 mg Intravenous Q6H PRN Lanney GinsMatthew Babish, PA-C      . metoCLOPramide (REGLAN) tablet 5-10 mg  5-10 mg Oral Q8H PRN Lanney GinsMatthew Babish, PA-C       Or  . metoCLOPramide (REGLAN) injection 5-10 mg  5-10 mg Intravenous Q8H PRN Lanney GinsMatthew Babish, PA-C      . ondansetron [facility](ZOFRAN) tablet 4 mg  4 mg Oral Q6H PRN Lanney GinsMatthew Babish, PA-C       Or  . ondansetron [facility](ZOFRAN) injection 4 mg  4 mg Intravenous Q6H PRN Lanney GinsMatthew Babish, PA-C   4 mg at 08/20/15 0705  . polyethylene glycol (MIRALAX / GLYCOLAX) packet 17 g  17 g Oral BID Lanney GinsMatthew Babish, PA-C   17 g at 08/19/15 2346  . sodium chloride 0.9 % 1,000 mL with potassium chloride 10 mEq infusion  100  mL/hr Intravenous Continuous Lanney GinsMatthew Babish, PA-C 100 mL/hr at 08/19/15 2027 100 mL/hr at 08/19/15 2027  . SUMAtriptan (IMITREX) tablet 100 mg  100 mg Oral Q2H PRN Lanney GinsMatthew Babish, PA-C      . traMADol Janean Sark(ULTRAM) tablet 50-100 mg  50-100 mg Oral 4 times per day Lanney GinsMatthew Babish, PA-C   100 mg at 08/20/15 0606     Discharge Medications: Please see discharge summary for a list of discharge medications.  Relevant Imaging Results:  Relevant Lab Results:   Additional Information    Annetta MawKujawa,Alysson Geist G, LCSW

## 2015-08-20 NOTE — Clinical Social Work Placement (Signed)
   CLINICAL SOCIAL WORK PLACEMENT  NOTE  Date:  08/20/2015  Patient Details  Name: Melinda Washington MRN: [id number] Date of Birth: Feb 20, 1941  Clinical Social Work is seeking post-discharge placement for this patient at the Skilled  Nursing Facility level of care (*CSW will initial, date and re-position this form in  chart as items are completed):  No   Patient/family provided with [facility] Clinical Social Work Department's list of facilities offering this level of care within the geographic area requested by the patient (or if unable, by the patient's family).  Yes   Patient/family informed of their freedom to choose among providers that offer the needed level of care, that participate in Medicare, Medicaid or managed care program needed by the patient, have an available bed and are willing to accept the patient.  No   Patient/family informed of [facility]'s ownership interest in [facility] and [facility], as well as of the fact that they are under no obligation to receive care at these facilities.  PASRR submitted to EDS on 08/19/15     PASRR number received on 08/19/15     Existing PASRR number confirmed on       FL2 transmitted to all facilities in geographic area requested by pt/family on 08/20/15     FL2 transmitted to all facilities within larger geographic area on       Patient informed that his/her managed care company has contracts with or will negotiate with certain facilities, including the following:            Patient/family informed of bed offers received.  Patient chooses bed at       Physician recommends and patient chooses bed at      Patient to be transferred to   on  .  Patient to be transferred to facility by       Patient family notified on   of transfer.  Name of family member notified:        PHYSICIAN       Additional Comment:    _______________________________________________ Royetta AsalHaidinger, Ashkan Chamberland Lee, Alexander MtLCSW  [phone] 08/20/2015,  2:38 PM

## 2015-08-20 NOTE — Clinical Social Work Note (Signed)
Clinical Social Work Assessment  Patient Details  Name: Melinda Washington MRN: [id number] Date of Birth: 14-Apr-1941  Date of referral:  08/20/15               Reason for consult:  Discharge Planning, Facility Placement                Permission sought to share information with:  Facility Art therapist granted to share information::  Yes, Verbal Permission Granted  Name::        Agency::     Relationship::     Contact Information:     Housing/Transportation Living arrangements for the past 2 months:  Apartment Source of Information:  Patient Patient Interpreter Needed:  None Criminal Activity/Legal Involvement Pertinent to Current Situation/Hospitalization:  No - Comment as needed Significant Relationships:  Adult Children Lives with:  Self Do you feel safe going back to the place where you live?   (ST Rehab needed.) Need for family participation in patient care:  No (Coment)  Care giving concerns: Pt's care cannot be managed at home following hospital d/c.   Social Worker assessment / plan:  Pt hospitalized on 08/19/15 for pre planned  left hip total arthroplasty. CSW met with pt to assist with d/c planning. Pt has made prior arrangements to have ST Rehab at [facility] following hospital d/c. Clinical info has been provided and confirmation is pending. CSW will continue to follow to assist with d/c planning to SNF. Employment status:  Retired Nurse, adult PT Recommendations:  [place] / Referral to community resources:  [place]  Patient/Family's Response to care:  Pt feels ST Rehab is needed.  Patient/Family's Understanding of and Emotional Response to Diagnosis, Current Treatment, and Prognosis:  Pt is aware of her medical status. She is motivated to work with therapy and is looking forward to rehab at [facility].  Emotional Assessment Appearance:  Appears stated  age Attitude/Demeanor/Rapport:  Other (cooperative) Affect (typically observed):  Calm, Pleasant Orientation:  Oriented to Self, Oriented to Place, Oriented to  Time, Oriented to Situation Alcohol / Substance use:  Not Applicable Psych involvement (Current and /or in the community):  No (Comment)  Discharge Needs  Concerns to be addressed:  Discharge Planning Concerns Readmission within the last 30 days:  No Current discharge risk:  None Barriers to Discharge:  No Barriers Identified   Loraine Maple  [phone] 08/20/2015, 2:32 PM

## 2015-08-20 NOTE — Progress Notes (Signed)
     Subjective: 1 Day Post-Op Procedure(s) (LRB): TOTAL HIP ARTHROPLASTY ANTERIOR APPROACH (Left)   Patient reports pain as mild, pain controlled. No events throughout the night. Little nausea with pain medications, but this is normal for her.  Planning on SNF upon discharge.  Objective:   VITALS:   Filed Vitals:   08/20/15 0250 08/20/15 0619  BP: 113/66 128/84  Pulse: 75 70  Temp: 98.5 F (36.9 C) 98.5 F (36.9 C)  Resp: 16 16    Dorsiflexion/Plantar flexion intact Incision: dressing C/D/I No cellulitis present Compartment soft  LABS  Recent Labs  08/20/15 0415  HGB 9.0*  HCT 27.8*  WBC 8.8  PLT 230     Recent Labs  08/20/15 0415  NA 137  K 4.0  BUN 14  CREATININE 0.79  GLUCOSE 160*     Assessment/Plan: 1 Day Post-Op Procedure(s) (LRB): TOTAL HIP ARTHROPLASTY ANTERIOR APPROACH (Left) Foley cath d/c'ed Advance diet Up with therapy D/C IV fluids Discharge to SNF eventually, when ready   Overweight (BMI 25-29.9)  Estimated body mass index is 25.69 kg/(m^2) as calculated from the following:   Height as of this encounter: 5\' 3"  (1.6 m).   Weight as of this encounter: 65.772 kg (145 lb). Patient also counseled that weight may inhibit the healing process Patient counseled that losing weight will help with future health issues       Anastasio AuerbachMatthew S. Frantz Quattrone   PAC  08/20/2015, 9:08 AM

## 2015-08-21 DIAGNOSIS — M1612 Unilateral primary osteoarthritis, left hip: Secondary | ICD-10-CM | POA: Diagnosis not present

## 2015-08-21 DIAGNOSIS — F419 Anxiety disorder, unspecified: Secondary | ICD-10-CM | POA: Diagnosis not present

## 2015-08-21 DIAGNOSIS — G43009 Migraine without aura, not intractable, without status migrainosus: Secondary | ICD-10-CM | POA: Diagnosis not present

## 2015-08-21 DIAGNOSIS — M6281 Muscle weakness (generalized): Secondary | ICD-10-CM | POA: Diagnosis not present

## 2015-08-21 DIAGNOSIS — F411 Generalized anxiety disorder: Secondary | ICD-10-CM | POA: Diagnosis not present

## 2015-08-21 DIAGNOSIS — I10 Essential (primary) hypertension: Secondary | ICD-10-CM | POA: Diagnosis not present

## 2015-08-21 DIAGNOSIS — M25552 Pain in left hip: Secondary | ICD-10-CM | POA: Diagnosis not present

## 2015-08-21 DIAGNOSIS — R2681 Unsteadiness on feet: Secondary | ICD-10-CM | POA: Diagnosis not present

## 2015-08-21 DIAGNOSIS — D509 Iron deficiency anemia, unspecified: Secondary | ICD-10-CM | POA: Diagnosis not present

## 2015-08-21 DIAGNOSIS — Z471 Aftercare following joint replacement surgery: Secondary | ICD-10-CM | POA: Diagnosis not present

## 2015-08-21 DIAGNOSIS — Z96642 Presence of left artificial hip joint: Secondary | ICD-10-CM | POA: Diagnosis not present

## 2015-08-21 LAB — CBC
HCT: 27.8 % — ABNORMAL LOW (ref 36.0–46.0)
Hemoglobin: 8.8 g/dL — ABNORMAL LOW (ref 12.0–15.0)
MCH: 29.3 pg (ref 26.0–34.0)
MCHC: 31.7 g/dL (ref 30.0–36.0)
MCV: 92.7 fL (ref 78.0–100.0)
PLATELETS: 236 10*3/uL (ref 150–400)
RBC: 3 MIL/uL — ABNORMAL LOW (ref 3.87–5.11)
RDW: 13.7 % (ref 11.5–15.5)
WBC: 12.2 10*3/uL — AB (ref 4.0–10.5)

## 2015-08-21 LAB — BASIC METABOLIC PANEL
Anion gap: 6 (ref 5–15)
BUN: 12 mg/dL (ref 6–20)
CALCIUM: 9.2 mg/dL (ref 8.9–10.3)
CO2: 27 mmol/L (ref 22–32)
Chloride: 109 mmol/L (ref 101–111)
Creatinine, Ser: 0.71 mg/dL (ref 0.44–1.00)
GFR calc Af Amer: >60 mL/min (ref >60)
GLUCOSE: 145 mg/dL — AB (ref 65–99)
Potassium: 4.4 mmol/L (ref 3.5–5.1)
Sodium: 142 mmol/L (ref 135–145)

## 2015-08-21 MED ORDER — TRAMADOL HCL 50 MG PO TABS: 50.0000 mg | ORAL_TABLET | Freq: Four times a day (QID) | ORAL | Status: DC

## 2015-08-21 MED ORDER — FERROUS SULFATE 325 (65 FE) MG PO TABS: 325.0000 mg | ORAL_TABLET | Freq: Three times a day (TID) | ORAL | Status: DC

## 2015-08-21 MED ORDER — ACETAMINOPHEN 500 MG PO TABS: 1000.0000 mg | ORAL_TABLET | Freq: Three times a day (TID) | ORAL | Status: DC

## 2015-08-21 MED ORDER — ASPIRIN 325 MG PO TBEC: 325.0000 mg | DELAYED_RELEASE_TABLET | Freq: Two times a day (BID) | ORAL | Status: AC

## 2015-08-21 MED ORDER — ALPRAZOLAM 0.25 MG PO TABS: 0.2500 mg | ORAL_TABLET | Freq: Three times a day (TID) | ORAL | Status: DC | PRN

## 2015-08-21 MED ORDER — POLYETHYLENE GLYCOL 3350 17 G PO PACK: 17.0000 g | PACK | Freq: Two times a day (BID) | ORAL | Status: DC

## 2015-08-21 MED ORDER — DOCUSATE SODIUM 100 MG PO CAPS: 100.0000 mg | ORAL_CAPSULE | Freq: Two times a day (BID) | ORAL | Status: DC

## 2015-08-21 NOTE — Discharge Summary (Signed)
Physician Discharge Summary  Patient ID: CICLEY GANESH MRN: [id number] DOB/AGE: 02/28/1941 74 y.o.  Admit date: 08/19/2015 Discharge date:  08/21/2015  Procedures:  Procedure(s) (LRB): TOTAL HIP ARTHROPLASTY ANTERIOR APPROACH (Left)  Attending Physician:  Dr. Durene Romans   Admission Diagnoses:   Left hip primary OA / pain  Discharge Diagnoses:  Principal Problem:   S/P left THA, AA Active Problems:   Overweight (BMI 25.0-29.9)  Past Medical History  Diagnosis Date  . Diverticulitis   . Hypertension   . Anxiety   . Headache     migraines  . Arthritis     HPI:    Melinda Washington, 74 y.o. female, has a history of pain and functional disability in the left hip(s) due to arthritis and patient has failed non-surgical conservative treatments for greater than 12 weeks to include NSAID's and/or analgesics, use of assistive devices and activity modification. Onset of symptoms was gradual starting ~4 years ago with gradually worsening course since that time.The patient noted no past surgery on the left hip(s). Patient currently rates pain in the left hip at 9 out of 10 with activity. Patient has worsening of pain with activity and weight bearing, trendelenberg gait, pain that interfers with activities of daily living and pain with passive range of motion. Patient has evidence of periarticular osteophytes and joint space narrowing by imaging studies. This condition presents safety issues increasing the risk of falls. There is no current active infection. Risks, benefits and expectations were discussed with the patient. Risks including but not limited to the risk of anesthesia, blood clots, nerve damage, blood vessel damage, failure of the prosthesis, infection and up to and including death. Patient understand the risks, benefits and expectations and wishes to proceed with surgery.   PCP: Elvina Sidle, MD   Discharged Condition: good  Hospital Course:  Patient underwent  the above stated procedure on 08/19/2015. Patient tolerated the procedure well and brought to the recovery room in good condition and subsequently to the floor.  POD #1 BP: 128/84 ; Pulse: 70 ; Temp: 98.5 F (36.9 C) ; Resp: 16 Patient reports pain as mild, pain controlled. No events throughout the night. Little nausea with pain medications, but this is normal for her. Planning on SNF upon discharge. Dorsiflexion/plantar flexion intact, incision: dressing C/D/I, no cellulitis present and compartment soft.   LABS  Basename    HGB     9.0  HCT     27.8   POD #2  BP: 141/91 ; Pulse: 62 ; Temp: 98.2 F (36.8 C) ; Resp: 16 Patient reports pain as mild, pain controlled. No events throughout the night. Ready to be discharged to skilled nursing facility. Dorsiflexion/plantar flexion intact, incision: dressing C/D/I, no cellulitis present and compartment soft.   LABS  Basename    HGB     8.8  HCT     27.8    Discharge Exam: General appearance: alert, cooperative and no distress Extremities: Homans sign is negative, no sign of DVT, no edema, redness or tenderness in the calves or thighs and no ulcers, gangrene or trophic changes  Disposition:   Skilled nursing facility with follow up in 2 weeks   Follow-up Information    Follow up with Shelda Pal, MD. Schedule an appointment as soon as possible for a visit in 2 weeks.   Specialty:  Orthopedic Surgery   Contact information:   [address] [place] [address] [phone]       Follow up  with [facility] .   Specialty:  Skilled Nursing Facility   Contact information:   [address] [place] [address] [phone]      Discharge Instructions    Call MD / Call 911    Complete by:  As directed   If you experience chest pain or shortness of breath, CALL 911 and be transported to the hospital emergency room.  If you develope a fever above 101 F, pus (white drainage) or increased  drainage or redness at the wound, or calf pain, call your surgeon's office.     Change dressing    Complete by:  As directed   Maintain surgical dressing until follow up in the clinic. If the edges start to pull up, may reinforce with tape. If the dressing is no longer working, may remove and cover with gauze and tape, but must keep the area dry and clean.  Call with any questions or concerns.     Constipation Prevention    Complete by:  As directed   Drink plenty of fluids.  Prune juice may be helpful.  You may use a stool softener, such as Colace (over the counter) 100 mg twice a day.  Use MiraLax (over the counter) for constipation as needed.     Diet - low sodium heart healthy    Complete by:  As directed      Discharge instructions    Complete by:  As directed   Maintain surgical dressing until follow up in the clinic. If the edges start to pull up, may reinforce with tape. If the dressing is no longer working, may remove and cover with gauze and tape, but must keep the area dry and clean.  Follow up in 2 weeks at [facility]. Call with any questions or concerns.     Increase activity slowly as tolerated    Complete by:  As directed   Weight bearing as tolerated with assist device (walker, cane, etc) as directed, use it as long as suggested by your surgeon or therapist, typically at least 4-6 weeks.     TED hose    Complete by:  As directed   Use stockings (TED hose) for 2 weeks on both leg(s).  You may remove them at night for sleeping.             Medication List    STOP taking these medications        triamcinolone cream 0.1 %  Commonly known as:  KENALOG      TAKE these medications        acetaminophen 500 MG tablet  Commonly known as:  TYLENOL  Take 2 tablets (1,000 mg total) by mouth every 8 (eight) hours.     ALPRAZolam 0.25 MG tablet  Commonly known as:  XANAX  Take 1 tablet (0.25 mg total) by mouth 3 (three) times daily as needed for anxiety.      aspirin 325 MG EC tablet  Take 1 tablet (325 mg total) by mouth 2 (two) times daily.     DIGESTIVE ENZYMES PO  Take 1 capsule by mouth 3 (three) times daily before meals.     docusate sodium 100 MG capsule  Commonly known as:  COLACE  Take 1 capsule (100 mg total) by mouth 2 (two) times daily.     ECHINACEA PO  Take 1 capsule by mouth 2 (two) times daily as needed (for cold symptoms).     ferrous sulfate 325 (65 FE)  MG tablet  Take 1 tablet (325 mg total) by mouth 3 (three) times daily after meals.     losartan 100 MG tablet  Commonly known as:  COZAAR  Take 1 tablet (100 mg total) by mouth daily.     MAGNESIUM CITRATE PO  Take 1,200 mg by mouth daily.     polyethylene glycol packet  Commonly known as:  MIRALAX / GLYCOLAX  Take 17 g by mouth 2 (two) times daily.     PROBIOTIC DAILY PO  Take 1 capsule by mouth 3 (three) times daily before meals.     SUMAtriptan 100 MG tablet  Commonly known as:  IMITREX  May take one tablet on the onset of headache. Then repeat in 2 hours as needed. Maximum 2 doses in 24 hours     traMADol 50 MG tablet  Commonly known as:  ULTRAM  Take 1-2 tablets (50-100 mg total) by mouth every 6 (six) hours.     Turmeric Powd  Take 1,000 mg by mouth daily.     vitamin C 1000 MG tablet  Take 4,000 mg by mouth 2 (two) times daily.     Vitamin D3 1000 UNITS Caps  Take 4,000 Units by mouth daily.     vitamin E 400 UNIT capsule  Generic drug:  vitamin E  Take 800 Units by mouth daily as needed (for cold symptoms).     VITAMIN K2 PO  Take 80 mcg by mouth daily.     Vitamin-B Complex Tabs  Take 1 tablet by mouth daily.     ZICAM COLD REMEDY PO  Take 1 tablet by mouth every 4 (four) hours as needed (for cold symptoms).         Signed: Anastasio AuerbachMatthew S. Shevawn Langenberg   PA-C  08/21/2015, 9:34 AM

## 2015-08-21 NOTE — Progress Notes (Signed)
     Subjective: 2 Days Post-Op Procedure(s) (LRB): TOTAL HIP ARTHROPLASTY ANTERIOR APPROACH (Left)   Patient reports pain as mild, pain controlled. No events throughout the night. Ready to be discharged to skilled nursing facility.  Objective:   VITALS:   Filed Vitals:   08/20/15 2030 08/21/15 0632  BP: 125/72 141/91  Pulse: 71 62  Temp: 98 F (36.7 C) 98.2 F (36.8 C)  Resp: 16 16    Dorsiflexion/Plantar flexion intact Incision: dressing C/D/I No cellulitis present Compartment soft  LABS  Recent Labs  08/20/15 0415 08/21/15 0548  HGB 9.0* 8.8*  HCT 27.8* 27.8*  WBC 8.8 12.2*  PLT 230 236     Recent Labs  08/20/15 0415 08/21/15 0548  NA 137 142  K 4.0 4.4  BUN 14 12  CREATININE 0.79 0.71  GLUCOSE 160* 145*     Assessment/Plan: 2 Days Post-Op Procedure(s) (LRB): TOTAL HIP ARTHROPLASTY ANTERIOR APPROACH (Left) Up with therapy Discharge to SNF [facility]. Follow up with OLIN,Jazyah Butsch D in 2 weeks.  Contact information:  [facility] [address], [address] [place] [id number] [phone]    Overweight (BMI 25-29.9) Estimated body mass index is 25.69 kg/(m^2) as calculated from the following:   Height as of this encounter: 5\' 3"  (1.6 m).   Weight as of this encounter: 65.772 kg (145 lb). Patient also counseled that weight may inhibit the healing process Patient counseled that losing weight will help with future health issues      Anastasio AuerbachMatthew S. Earlie Schank   PAC  08/21/2015, 9:27 AM

## 2015-08-21 NOTE — Clinical Social Work Placement (Signed)
   CLINICAL SOCIAL WORK PLACEMENT  NOTE  Date:  08/21/2015  Patient Details  Name: Melinda SawyersRosemarie W Poet MRN: [id number] Date of Birth: [birth year]  Clinical Social Work is seeking post-discharge placement for this patient at the Skilled  Nursing Facility level of care (*CSW will initial, date and re-position this form in  chart as items are completed):  No   Patient/family provided with [facility] Clinical Social Work Department's list of facilities offering this level of care within the geographic area requested by the patient (or if unable, by the patient's family).  Yes   Patient/family informed of their freedom to choose among providers that offer the needed level of care, that participate in Medicare, Medicaid or managed care program needed by the patient, have an available bed and are willing to accept the patient.  No   Patient/family informed of [facility]'s ownership interest in [facility] and [facility], as well as of the fact that they are under no obligation to receive care at these facilities.  PASRR submitted to EDS on 08/19/15     PASRR number received on 08/19/15     Existing PASRR number confirmed on       FL2 transmitted to all facilities in geographic area requested by pt/family on 08/20/15     FL2 transmitted to all facilities within larger geographic area on       Patient informed that his/her managed care company has contracts with or will negotiate with certain facilities, including the following:        Yes   Patient/family informed of bed offers received.  [facility]     Physician recommends and patient chooses bed at [facility]    Patient to be [facility] on 08/21/15.  Patient to be transferred to facility by CAR     Patient family notified on 08/21/15 of transfer.  Name of family member notified:  Pt contacted family directly.     PHYSICIAN       Additional Comment: Pt is in agreement with  d/c to [facility] today. PT has approved transport by car. NSG has reviewed d/c summary, scripts, avs. Scripts included in d/c packet. D/C summary sent to SNF for review prior to d/c. D/C packet provided to pt prior to d/c.    _______________________________________________ Royetta AsalHaidinger, Cumi Sanagustin Lee, LCSW [phone] 08/21/2015, 2:26 PM

## 2015-08-21 NOTE — Progress Notes (Signed)
Physical Therapy Treatment Patient Details Name: Melinda Washington Age MRN: [id number] DOB: 10-27-1940 Today's Date: 08/21/2015    History of Present Illness L THR    PT Comments    POD # 2 am session.  Assisted OOB with increased time tp amb in hallway.  Assisted to bathroom then back to bed per pt request due to nausea and overall feeling "bad".  Will allow pt to rest before she goes to [facility].    Follow Up Recommendations  SNF (via car)     Equipment Recommendations       Recommendations for Other Services       Precautions / Restrictions Precautions Precautions: Fall Restrictions Weight Bearing Restrictions: No Other Position/Activity Restrictions: WBAT    Mobility  Bed Mobility Overal bed mobility: Needs Assistance Bed Mobility: Supine to Sit;Sit to Supine     Supine to sit: Min assist Sit to supine: Min assist   General bed mobility comments: increased time.  Pt stated, "let me try myself".   Transfers Overall transfer level: Needs assistance Equipment used: Rolling walker (2 wheeled) Transfers: Sit to/from Stand Sit to Stand: Min assist         General transfer comment: cues for LE management and use of UEs to self assist  Ambulation/Gait Ambulation/Gait assistance: Min assist Ambulation Distance (Feet): 85 Feet Assistive device: Rolling walker (2 wheeled) Gait Pattern/deviations: Step-to pattern;Decreased stance time - left;Trunk flexed Gait velocity: decreased   General Gait Details: cues for sequence, posture and position from RW.  Pt vaulting over L LE and walking on toes on R, states L LE is longer    Stairs            Wheelchair Mobility    Modified Rankin (Stroke Patients Only)       Balance                                    Cognition Arousal/Alertness: Awake/alert Behavior During Therapy: WFL for tasks assessed/performed Overall Cognitive Status: Within Functional Limits for tasks assessed                      Exercises      General Comments        Pertinent Vitals/Pain Pain Assessment: 0-10 Pain Score: 4  Pain Location: L hip Pain Descriptors / Indicators: Discomfort;Grimacing;Sore Pain Intervention(s): Monitored during session;Premedicated before session;Repositioned;Ice applied    Home Living                      Prior Function            PT Goals (current goals can now be found in the care plan section) Progress towards PT goals: Progressing toward goals    Frequency  7X/week    PT Plan Current plan remains appropriate    Co-evaluation             End of Session Equipment Utilized During Treatment: Gait belt Activity Tolerance: Patient tolerated treatment well Patient left: in bed;with call bell/phone within reach     Time: 1120-1148 PT Time Calculation (min) (ACUTE ONLY): 28 min  Charges:  $Gait Training: 8-22 mins $Therapeutic Activity: 8-22 mins                    G Codes:      Melinda Washington  PTA [facility]  Acute  Rehab Pager  [phone]  

## 2015-08-25 ENCOUNTER — Non-Acute Institutional Stay (SKILLED_NURSING_FACILITY): Payer: Medicare Other | Admitting: Internal Medicine

## 2015-08-25 ENCOUNTER — Encounter: Payer: Self-pay | Admitting: Internal Medicine

## 2015-08-25 DIAGNOSIS — D62 Acute posthemorrhagic anemia: Secondary | ICD-10-CM

## 2015-08-25 DIAGNOSIS — K5900 Constipation, unspecified: Secondary | ICD-10-CM | POA: Diagnosis not present

## 2015-08-25 DIAGNOSIS — R2681 Unsteadiness on feet: Secondary | ICD-10-CM | POA: Diagnosis not present

## 2015-08-25 DIAGNOSIS — D72829 Elevated white blood cell count, unspecified: Secondary | ICD-10-CM | POA: Diagnosis not present

## 2015-08-25 DIAGNOSIS — F411 Generalized anxiety disorder: Secondary | ICD-10-CM | POA: Diagnosis not present

## 2015-08-25 DIAGNOSIS — I10 Essential (primary) hypertension: Secondary | ICD-10-CM

## 2015-08-25 DIAGNOSIS — M1612 Unilateral primary osteoarthritis, left hip: Secondary | ICD-10-CM

## 2015-08-25 DIAGNOSIS — G43009 Migraine without aura, not intractable, without status migrainosus: Secondary | ICD-10-CM

## 2015-08-25 NOTE — Progress Notes (Signed)
Patient ID: Melinda Washington, female   DOB: [birth year], 74 y.o.   MRN: [id number]     [facility]  PCP: Elvina Sidle, MD  Code Status: Full Code   Allergies  Allergen Reactions  . Sulfamethoxazole Rash  . Ciprofloxacin     Pt says it makes her feeling "very weak."   . Codeine Nausea And Vomiting    All pain medications cause some degree of nausea (moderate to severe)- able to tolerate when premedicated with antiemetic  . Ketorolac Tromethamine Anxiety    Tremors, teeth chattering  . Latex Rash    Redness & burning.  Marland Kitchen Penicillins Rash    Has patient had a PCN reaction causing immediate rash, facial/tongue/throat swelling, SOB or lightheadedness with hypotension: No Has patient had a PCN reaction causing severe rash involving mucus membranes or skin necrosis: No Has patient had a PCN reaction that required hospitalization No Has patient had a PCN reaction occurring within the last 10 years: No If all of the above answers are "NO", then may proceed with Cephalosporin use.     Chief Complaint  Patient presents with  . New Admit To SNF    New Admission      HPI:  74 y.o. patient is here for short term rehabilitation post hospital admission from 08/17/15-08/21/15 with left hip OA. She underwent left total hip arthroplasty. She is seen in her room today. Her migraine headache is bothering her this am. When she is not able to have good sleep, it triggers her migraine. She is anxious about being here and would like to return home. Her pain is under control with current pain regimen. She has been having loose stool. No other concerns. Has not worked with therapy today and is worried if they are going to work with her.   Review of Systems:  Constitutional: Negative for fever, chills, diaphoresis.  HENT: Negative for headache, congestion, nasal discharge, difficulty swallowing.   Eyes: Negative for eye pain, blurred vision, double vision and discharge.    Respiratory: Negative for cough, shortness of breath and wheezing.   Cardiovascular: Negative for chest pain, palpitations, leg swelling.  Gastrointestinal: Negative for heartburn, nausea, vomiting, abdominal pain. Genitourinary: Negative for dysuria, flank pain.  Musculoskeletal: Negative for back pain, falls. Skin: Negative for itching, rash.  Neurological: Negative for dizziness, tingling, focal weakness Psychiatric/Behavioral: Negative for depression    Past Medical History  Diagnosis Date  . Diverticulitis   . Hypertension   . Anxiety   . Headache     migraines  . Arthritis    Past Surgical History  Procedure Laterality Date  . Hernia repair    . Abdominal hysterectomy      Partial  . Carpal tunnel release Right   . Bunionectomy Left   . Abdominal surgery due to barium enema prep - enlarged intestines which resulted in hernias.  has mesh in abdomen from incisional hernias    . Tonsillectomy    . Total hip arthroplasty Left 08/19/2015    Procedure: TOTAL HIP ARTHROPLASTY ANTERIOR APPROACH;  Surgeon: Durene Romans, MD;  Location: [facility];  Service: Orthopedics;  Laterality: Left;   Social History:   reports that she has never smoked. She has never used smokeless tobacco. She reports that she drinks alcohol. She reports that she does not use illicit drugs.  History reviewed. No pertinent family history.  Medications:   Medication List       This list is accurate as of: 08/25/15 10:40  AM.  Always use your most recent med list.               acetaminophen 500 MG tablet  Commonly known as:  TYLENOL  Take 2 tablets (1,000 mg total) by mouth every 8 (eight) hours.     ALPRAZolam 0.25 MG tablet  Commonly known as:  XANAX  Take 1 tablet (0.25 mg total) by mouth 3 (three) times daily as needed for anxiety.     aspirin 325 MG EC tablet  Take 1 tablet (325 mg total) by mouth 2 (two) times daily.     DIGESTIVE ENZYMES PO  Take 1 capsule by mouth 3 (three) times daily  before meals.     docusate sodium 100 MG capsule  Commonly known as:  COLACE  Take 1 capsule (100 mg total) by mouth 2 (two) times daily.     ECHINACEA PO  Take 1 capsule by mouth 2 (two) times daily as needed (for cold symptoms).     ferrous sulfate 325 (65 FE) MG tablet  Take 1 tablet (325 mg total) by mouth 3 (three) times daily after meals.     losartan 100 MG tablet  Commonly known as:  COZAAR  Take 1 tablet (100 mg total) by mouth daily.     MAGNESIUM CITRATE PO  Take 1,200 mg by mouth daily.     polyethylene glycol packet  Commonly known as:  MIRALAX / GLYCOLAX  Take 17 g by mouth 2 (two) times daily.     PROBIOTIC DAILY PO  Take 1 capsule by mouth 3 (three) times daily before meals.     SUMAtriptan 100 MG tablet  Commonly known as:  IMITREX  Take 1 tablet (100 mg total) by mouth every 2 (two) hours as needed for migraine. May take one tablet on the onset of headache. Then repeat in 2 hours as needed. Maximum 2 doses in 24 hours     traMADol 50 MG tablet  Commonly known as:  ULTRAM  Take 1-2 tablets (50-100 mg total) by mouth every 6 (six) hours.     Turmeric Powd  Take 1,000 mg by mouth daily.     vitamin C 1000 MG tablet  Take 4,000 mg by mouth 2 (two) times daily.     Vitamin D3 1000 UNITS Caps  Take 4,000 Units by mouth daily.     vitamin E 400 UNIT capsule  Generic drug:  vitamin E  Take 800 Units by mouth daily as needed (for cold symptoms).     VITAMIN K2 PO  Take 80 mcg by mouth daily.     Vitamin-B Complex Tabs  Take 1 tablet by mouth daily.     ZICAM COLD REMEDY PO  Take 1 tablet by mouth every 4 (four) hours as needed (for cold symptoms).         Physical Exam: Filed Vitals:   08/25/15 1026  BP: 159/97  Pulse: 78  Temp: 97.8 F (36.6 C)  TempSrc: Oral  Resp: 14  SpO2: 98%    General- elderly female, well built, in no acute distress Head- normocephalic, atraumatic, no temporal tenderness Nose- normal nasal mucosa, no maxillary  or frontal sinus tenderness, no nasal discharge Throat- moist mucus membrane Eyes- PERRLA, EOMI, no pallor, no icterus, no discharge, normal conjunctiva, normal sclera Neck- no cervical lymphadenopathy Cardiovascular- normal s1,s2, no murmurs, palpable dorsalis pedis and radial pulses, trace leg edema Respiratory- bilateral clear to auscultation, no wheeze, no rhonchi, no crackles, no use of  accessory muscles Abdomen- bowel sounds present, soft, non tender Musculoskeletal- able to move all 4 extremities, limited left leg range of motion  Neurological- no focal deficit, alert and oriented to person, place and time Skin- warm and dry, left hip surgical incision with aquacel dressing in place clean and dry Psychiatry- anxious mood   Labs reviewed: Basic Metabolic Panel:  Recent Labs  16/06/9611/06/16 1550 08/20/15 0415 08/21/15 0548  NA 140 137 142  K 4.3 4.0 4.4  CL 109 108 109  CO2 22 24 27   GLUCOSE 91 160* 145*  BUN 17 14 12   CREATININE 0.84 0.79 0.71  CALCIUM 9.4 8.7* 9.2   CBC:  Recent Labs  08/12/15 1550 08/20/15 0415 08/21/15 0548  WBC 6.6 8.8 12.2*  HGB 11.4* 9.0* 8.8*  HCT 35.7* 27.8* 27.8*  MCV 89.9 90.0 92.7  PLT 297 230 236     Assessment/Plan  Unsteady gait Will have her work with physical therapy and occupational therapy team to help with gait training and muscle strengthening exercises.fall precautions. Skin care. Encourage to be out of bed.   Left hip OA S/p left total hip arthroplasty. Has f/u with orthopedics. Continue aspirin 325 mg bid for dvt prophylaxis. Continue tylenol 1000 mg tid with tramadol 50 mg 1-2 tab q6h prn pain. Will have patient work with PT/OT as tolerated to regain strength and restore function.  Fall precautions are in place. Continue vitamin d supplement.  Leukocytosis Dressing to left hip clean and dry. Afebrile. Likely has reactive leukocytosis. Monitor cbc with diff  Blood loss anemia Post op, monitor cbc. Continue feso4 325 mg  tid for now and vitamin c  HTN Elevated BP reading. Her pain and headache could be contributing some. Check bp q shift x 1 week. If > 3 reading > 140/90, adjust bp meds. Continue losartan 100 mg daily for now  GAD Continue xanax 0.25 mg tid prn, patient advised to ask for it if needed. Refusing psychiatry and psyhcology counselling for now  Migraine headache Nurse to provide sumatriptan now. Monitor headache  Constipation Has loose stool. Discontinue miralax for now. Continue colace 100 mg bid with her on pain medication and iron supplement   Goals of care: short term rehabilitation   Labs/tests ordered: cbc with diff, cmp  Family/ staff Communication: reviewed care plan with patient and nursing supervisor    Oneal GroutMAHIMA Jennefer Kopp, MD  [facility] [phone] (Monday-Friday 8 am - 5 pm) [phone] (afterhours)

## 2015-09-03 DIAGNOSIS — Z96642 Presence of left artificial hip joint: Secondary | ICD-10-CM | POA: Diagnosis not present

## 2015-09-03 DIAGNOSIS — M25562 Pain in left knee: Secondary | ICD-10-CM | POA: Diagnosis not present

## 2015-09-03 DIAGNOSIS — Z471 Aftercare following joint replacement surgery: Secondary | ICD-10-CM | POA: Diagnosis not present

## 2015-09-23 DIAGNOSIS — M1612 Unilateral primary osteoarthritis, left hip: Secondary | ICD-10-CM | POA: Diagnosis not present

## 2015-09-30 DIAGNOSIS — M1612 Unilateral primary osteoarthritis, left hip: Secondary | ICD-10-CM | POA: Diagnosis not present

## 2015-10-02 DIAGNOSIS — M1612 Unilateral primary osteoarthritis, left hip: Secondary | ICD-10-CM | POA: Diagnosis not present

## 2015-10-07 DIAGNOSIS — M1612 Unilateral primary osteoarthritis, left hip: Secondary | ICD-10-CM | POA: Diagnosis not present

## 2015-10-09 DIAGNOSIS — Z96642 Presence of left artificial hip joint: Secondary | ICD-10-CM | POA: Diagnosis not present

## 2015-10-09 DIAGNOSIS — Z471 Aftercare following joint replacement surgery: Secondary | ICD-10-CM | POA: Diagnosis not present

## 2015-10-13 DIAGNOSIS — M1612 Unilateral primary osteoarthritis, left hip: Secondary | ICD-10-CM | POA: Diagnosis not present

## 2015-10-17 DIAGNOSIS — M1612 Unilateral primary osteoarthritis, left hip: Secondary | ICD-10-CM | POA: Diagnosis not present

## 2015-10-20 DIAGNOSIS — M1612 Unilateral primary osteoarthritis, left hip: Secondary | ICD-10-CM | POA: Diagnosis not present

## 2015-10-22 DIAGNOSIS — M1612 Unilateral primary osteoarthritis, left hip: Secondary | ICD-10-CM | POA: Diagnosis not present

## 2015-10-29 DIAGNOSIS — M1612 Unilateral primary osteoarthritis, left hip: Secondary | ICD-10-CM | POA: Diagnosis not present

## 2015-11-10 DIAGNOSIS — M1612 Unilateral primary osteoarthritis, left hip: Secondary | ICD-10-CM | POA: Diagnosis not present

## 2015-11-19 DIAGNOSIS — Z96642 Presence of left artificial hip joint: Secondary | ICD-10-CM | POA: Diagnosis not present

## 2015-11-19 DIAGNOSIS — M1612 Unilateral primary osteoarthritis, left hip: Secondary | ICD-10-CM | POA: Diagnosis not present

## 2015-11-19 DIAGNOSIS — Z471 Aftercare following joint replacement surgery: Secondary | ICD-10-CM | POA: Diagnosis not present

## 2015-12-05 ENCOUNTER — Telehealth: Payer: Self-pay | Admitting: Family Medicine

## 2015-12-05 NOTE — Telephone Encounter (Signed)
Pt would like paperwork for Handicap Placard completed///it expires today 12/05/15. Advised pt that Dr. Milus GlazierLauenstein is here and she can have a ov for ppwrk to be completed but it is highly unlikely that he will be able to complete and return it to day w/o an OV.  [phone]

## 2015-12-08 NOTE — Telephone Encounter (Signed)
Ready for pick up. Called pt to let her know.

## 2015-12-18 ENCOUNTER — Emergency Department (HOSPITAL_COMMUNITY): Payer: Medicare Other

## 2015-12-18 ENCOUNTER — Inpatient Hospital Stay (HOSPITAL_COMMUNITY)
Admission: EM | Admit: 2015-12-18 | Discharge: 2015-12-28 | DRG: 390 | Disposition: A | Discharge status: Home / Self Care | Payer: Medicare Other | Attending: Internal Medicine | Admitting: Internal Medicine

## 2015-12-18 ENCOUNTER — Encounter (HOSPITAL_COMMUNITY): Payer: Self-pay | Admitting: Emergency Medicine

## 2015-12-18 DIAGNOSIS — D649 Anemia, unspecified: Secondary | ICD-10-CM | POA: Diagnosis present

## 2015-12-18 DIAGNOSIS — R140 Abdominal distension (gaseous): Secondary | ICD-10-CM | POA: Diagnosis not present

## 2015-12-18 DIAGNOSIS — K598 Other specified functional intestinal disorders: Secondary | ICD-10-CM | POA: Diagnosis not present

## 2015-12-18 DIAGNOSIS — I10 Essential (primary) hypertension: Secondary | ICD-10-CM | POA: Diagnosis not present

## 2015-12-18 DIAGNOSIS — M1990 Unspecified osteoarthritis, unspecified site: Secondary | ICD-10-CM | POA: Diagnosis not present

## 2015-12-18 DIAGNOSIS — Z0189 Encounter for other specified special examinations: Secondary | ICD-10-CM

## 2015-12-18 DIAGNOSIS — K5660 Unspecified intestinal obstruction: Secondary | ICD-10-CM | POA: Diagnosis not present

## 2015-12-18 DIAGNOSIS — Z4682 Encounter for fitting and adjustment of non-vascular catheter: Secondary | ICD-10-CM | POA: Diagnosis not present

## 2015-12-18 DIAGNOSIS — Z90710 Acquired absence of both cervix and uterus: Secondary | ICD-10-CM

## 2015-12-18 DIAGNOSIS — K5791 Diverticulosis of intestine, part unspecified, without perforation or abscess with bleeding: Secondary | ICD-10-CM | POA: Diagnosis not present

## 2015-12-18 DIAGNOSIS — F419 Anxiety disorder, unspecified: Secondary | ICD-10-CM | POA: Diagnosis present

## 2015-12-18 DIAGNOSIS — K5669 Other intestinal obstruction: Secondary | ICD-10-CM | POA: Diagnosis not present

## 2015-12-18 DIAGNOSIS — K5730 Diverticulosis of large intestine without perforation or abscess without bleeding: Secondary | ICD-10-CM | POA: Diagnosis present

## 2015-12-18 DIAGNOSIS — Z96642 Presence of left artificial hip joint: Secondary | ICD-10-CM | POA: Diagnosis present

## 2015-12-18 DIAGNOSIS — G43909 Migraine, unspecified, not intractable, without status migrainosus: Secondary | ICD-10-CM | POA: Diagnosis present

## 2015-12-18 DIAGNOSIS — K2970 Gastritis, unspecified, without bleeding: Secondary | ICD-10-CM | POA: Diagnosis not present

## 2015-12-18 DIAGNOSIS — K5790 Diverticulosis of intestine, part unspecified, without perforation or abscess without bleeding: Secondary | ICD-10-CM | POA: Diagnosis present

## 2015-12-18 DIAGNOSIS — K56609 Unspecified intestinal obstruction, unspecified as to partial versus complete obstruction: Secondary | ICD-10-CM | POA: Diagnosis present

## 2015-12-18 DIAGNOSIS — R112 Nausea with vomiting, unspecified: Secondary | ICD-10-CM | POA: Diagnosis not present

## 2015-12-18 DIAGNOSIS — R197 Diarrhea, unspecified: Secondary | ICD-10-CM | POA: Diagnosis not present

## 2015-12-18 LAB — CBC WITH DIFFERENTIAL/PLATELET
BASOS ABS: 0 10*3/uL (ref 0.0–0.1)
BASOS PCT: 0 %
EOS PCT: 0 %
Eosinophils Absolute: 0 10*3/uL (ref 0.0–0.7)
HCT: 31.5 % — ABNORMAL LOW (ref 36.0–46.0)
Hemoglobin: 9.9 g/dL — ABNORMAL LOW (ref 12.0–15.0)
Lymphocytes Relative: 8 %
Lymphs Abs: 0.7 10*3/uL (ref 0.7–4.0)
MCH: 26.3 pg (ref 26.0–34.0)
MCHC: 31.4 g/dL (ref 30.0–36.0)
MCV: 83.8 fL (ref 78.0–100.0)
MONO ABS: 0.5 10*3/uL (ref 0.1–1.0)
Monocytes Relative: 5 %
Neutro Abs: 7.8 10*3/uL — ABNORMAL HIGH (ref 1.7–7.7)
Neutrophils Relative %: 87 %
PLATELETS: 285 10*3/uL (ref 150–400)
RBC: 3.76 MIL/uL — ABNORMAL LOW (ref 3.87–5.11)
RDW: 15.9 % — AB (ref 11.5–15.5)
WBC: 9 10*3/uL (ref 4.0–10.5)

## 2015-12-18 LAB — URINALYSIS, ROUTINE W REFLEX MICROSCOPIC
Bilirubin Urine: NEGATIVE
GLUCOSE, UA: NEGATIVE mg/dL
Hgb urine dipstick: NEGATIVE
Ketones, ur: >80 mg/dL — AB
NITRITE: NEGATIVE
PROTEIN: 100 mg/dL — AB
Specific Gravity, Urine: 1.027 (ref 1.005–1.030)
pH: 8 (ref 5.0–8.0)

## 2015-12-18 LAB — COMPREHENSIVE METABOLIC PANEL
ALBUMIN: 3.5 g/dL (ref 3.5–5.0)
ALT: 11 U/L — ABNORMAL LOW (ref 14–54)
ANION GAP: 14 (ref 5–15)
AST: 12 U/L — AB (ref 15–41)
Alkaline Phosphatase: 88 U/L (ref 38–126)
BUN: 33 mg/dL — AB (ref 6–20)
CHLORIDE: 101 mmol/L (ref 101–111)
CO2: 26 mmol/L (ref 22–32)
Calcium: 9.4 mg/dL (ref 8.9–10.3)
Creatinine, Ser: 0.98 mg/dL (ref 0.44–1.00)
GFR calc Af Amer: >60 mL/min (ref >60)
GFR, EST NON AFRICAN AMERICAN: 56 mL/min — AB (ref >60)
Glucose, Bld: 120 mg/dL — ABNORMAL HIGH (ref 65–99)
POTASSIUM: 3.6 mmol/L (ref 3.5–5.1)
Sodium: 141 mmol/L (ref 135–145)
Total Bilirubin: 1.4 mg/dL — ABNORMAL HIGH (ref 0.3–1.2)
Total Protein: 6.9 g/dL (ref 6.5–8.1)

## 2015-12-18 LAB — URINE MICROSCOPIC-ADD ON

## 2015-12-18 LAB — MAGNESIUM: Magnesium: 2.4 mg/dL (ref 1.7–2.4)

## 2015-12-18 LAB — PHOSPHORUS: Phosphorus: 2.1 mg/dL — ABNORMAL LOW (ref 2.5–4.6)

## 2015-12-18 LAB — LIPASE, BLOOD: LIPASE: 24 U/L (ref 11–51)

## 2015-12-18 MED ORDER — POTASSIUM CHLORIDE IN NACL 20-0.9 MEQ/L-% IV SOLN
INTRAVENOUS | Status: DC
  Administered 2015-12-19 – 2015-12-25 (×10): via INTRAVENOUS
  Filled 2015-12-18 (×21): qty 1000

## 2015-12-18 MED ORDER — SODIUM CHLORIDE 0.9 % IV BOLUS (SEPSIS)
1000.0000 mL | Freq: Once | INTRAVENOUS | Status: AC
  Administered 2015-12-18: 1000 mL via INTRAVENOUS

## 2015-12-18 MED ORDER — SODIUM CHLORIDE 0.9 % IV SOLN
INTRAVENOUS | Status: DC
  Administered 2015-12-18: 21:00:00 via INTRAVENOUS

## 2015-12-18 MED ORDER — IOPAMIDOL (ISOVUE-300) INJECTION 61%
100.0000 mL | Freq: Once | INTRAVENOUS | Status: AC | PRN
  Administered 2015-12-18: 100 mL via INTRAVENOUS

## 2015-12-18 MED ORDER — MORPHINE SULFATE (PF) 4 MG/ML IV SOLN
4.0000 mg | Freq: Once | INTRAVENOUS | Status: AC
  Administered 2015-12-18: 4 mg via INTRAVENOUS
  Filled 2015-12-18: qty 1

## 2015-12-18 MED ORDER — DIPHENHYDRAMINE HCL 50 MG/ML IJ SOLN
25.0000 mg | Freq: Once | INTRAMUSCULAR | Status: DC
  Filled 2015-12-18: qty 1

## 2015-12-18 MED ORDER — DIATRIZOATE MEGLUMINE & SODIUM 66-10 % PO SOLN
15.0000 mL | Freq: Once | ORAL | Status: AC
  Administered 2015-12-18: 15 mL via ORAL
  Filled 2015-12-18: qty 30

## 2015-12-18 NOTE — ED Notes (Signed)
Pt reports generalized abd pain since Monday. Intermittent diarrhea. Made herself throw up this am which only helped for a short while. Hx of diverticulitis. EMS gave 50mcg fentanyl and 4mg  zofran prior to arrival which relieved pain

## 2015-12-18 NOTE — ED Notes (Signed)
Admitting MD at bedside.

## 2015-12-18 NOTE — H&P (Signed)
[facility] History and Physical  CELES DEDIC ZOX:[id number] DOB: Sep 29, 1940 DOA: 12/18/2015  Referring physician: Tandy Gaw, M.D. PCP: Elvina Sidle, MD   Chief Complaint: Abdominal pain  HPI: Melinda Washington is a 75 y.o. female with a past medical history of diverticulosis, hypertension, anxiety, migraine headaches, osteoarthritis, and the emergency department with a history of abdominal pain since Monday, associated with constipation since Sunday. Patient stated that she had an an episode of self-induced vomiting in the morning, which helped, but only for a while. She is states that the pain is similar to her episodes of diverticulitis and was relieved by fentanyl and Zofran given by EMS.   Imaging done in the emergency department shows a mid small bowel obstruction without any evidence of perforation.   Review of Systems:  Constitutional:  Positive fatigue. No weight loss, night sweats, Fevers, chills  HEENT:  No headaches, Difficulty swallowing,Tooth/dental problems,Sore throat,  No sneezing, itching, ear ache, nasal congestion, post nasal drip,  Cardio-vascular:  No chest pain, Orthopnea, PND, swelling in lower extremities, anasarca, dizziness, palpitations  GI:  Positive abdominal pain, nausea, vomiting, loss of appetite  No heartburn, indigestion, diarrhea, change in bowel habits Resp:  No shortness of breath with exertion or at rest. No excess mucus, no productive cough, No non-productive cough, No coughing up of blood.No change in color of mucus.No wheezing.No chest wall deformity  Skin:  no rash or lesions.  GU:  no dysuria, change in color of urine, no urgency or frequency. No flank pain.  Musculoskeletal:  No joint pain or swelling. No decreased range of motion. No back pain.  Psych:  No change in mood or affect. No depression or anxiety. No memory loss.   Past Medical History  Diagnosis Date  . Diverticulitis   . Hypertension   . Anxiety     . Headache     migraines  . Arthritis    Past Surgical History  Procedure Laterality Date  . Hernia repair    . Abdominal hysterectomy      Partial  . Carpal tunnel release Right   . Bunionectomy Left   . Abdominal surgery due to barium enema prep - enlarged intestines which resulted in hernias.  has mesh in abdomen from incisional hernias    . Tonsillectomy    . 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 She has never used smokeless tobacco. She reports that she drinks alcohol. She reports that she does not use illicit drugs.  Allergies  Allergen Reactions  . Sulfamethoxazole Rash  . Ciprofloxacin     Pt says it makes her feeling "very weak."   . Codeine Nausea And Vomiting    All pain medications cause some degree of nausea (moderate to severe)- able to tolerate when premedicated with antiemetic  . Ketorolac Tromethamine Anxiety    Tremors, teeth chattering  . Latex Rash    Redness & burning.  Marland Kitchen Penicillins Rash    Has patient had a PCN reaction causing immediate rash, facial/tongue/throat swelling, SOB or lightheadedness with hypotension: No Has patient had a PCN reaction causing severe rash involving mucus membranes or skin necrosis: No Has patient had a PCN reaction that required hospitalization No Has patient had a PCN reaction occurring within the last 10 years: No If all of the above  answers are "NO", then may proceed with Cephalosporin use.     History reviewed. No pertinent family history.   Prior to Admission medications   Medication Sig Start Date End Date Taking? Authorizing Provider  Ascorbic Acid (VITAMIN C) 1000 MG tablet Take 4,000 mg by mouth 2 (two) times daily.    Yes Historical Provider, MD  B Complex Vitamins (VITAMIN-B COMPLEX) TABS Take 1 tablet by mouth  daily.    Yes Historical Provider, MD  Cholecalciferol (VITAMIN D3) 1000 UNITS CAPS Take 4,000 Units by mouth daily.   Yes Historical Provider, MD  DIGESTIVE ENZYMES PO Take 1 capsule by mouth 2 (two) times daily with a meal.    Yes Historical Provider, MD  MAGNESIUM CITRATE PO Take 1,200 mg by mouth daily.   Yes Historical Provider, MD  Menaquinone-7 (VITAMIN K2 PO) Take 80 mcg by mouth daily.   Yes Historical Provider, MD  Probiotic Product (PROBIOTIC DAILY PO) Take 1 capsule by mouth daily.    Yes Historical Provider, MD  SUMAtriptan (IMITREX) 100 MG tablet Take 1 tablet (100 mg total) by mouth every 2 (two) hours as needed for migraine. May take one tablet on the onset of headache. Then repeat in 2 hours as needed. Maximum 2 doses in 24 hours 08/25/15  Yes Mahima Pandey, MD  Turmeric POWD Take 1,000 mg by mouth daily.    Yes Historical Provider, MD  acetaminophen (TYLENOL) 500 MG tablet Take 2 tablets (1,000 mg total) by mouth every 8 (eight) hours. Patient not taking: Reported on 12/18/2015 08/21/15   Lanney Gins, PA-C  ALPRAZolam Prudy Feeler) 0.25 MG tablet Take 1 tablet (0.25 mg total) by mouth 3 (three) times daily as needed for anxiety. Patient not taking: Reported on 12/18/2015 08/21/15   Lanney Gins, PA-C  docusate sodium (COLACE) 100 MG capsule Take 1 capsule (100 mg total) by mouth 2 (two) times daily. Patient not taking: Reported on 12/18/2015 08/21/15   Lanney Gins, PA-C  ferrous sulfate 325 (65 FE) MG tablet Take 1 tablet (325 mg total) by mouth 3 (three) times daily after meals. Patient not taking: Reported on 12/18/2015 08/21/15   Lanney Gins, PA-C  losartan (COZAAR) 100 MG tablet Take 1 tablet (100 mg total) by mouth daily. Patient not taking: Reported on 12/18/2015 06/02/15   Elvina Sidle, MD  polyethylene glycol [facility]) packet Take 17 g by mouth 2 (two) times daily. Patient not taking: Reported on 12/18/2015 08/21/15   Lanney Gins, PA-C  traMADol (ULTRAM)  50 MG tablet Take 1-2 tablets (50-100 mg total) by mouth every 6 (six) hours. Patient not taking: Reported on 12/18/2015 08/21/15   Lanney Gins, PA-C   Physical Exam: Filed Vitals:   12/18/15 1744 12/18/15 2023  BP: 152/100 151/99  Pulse: 84 86  Temp: 98.9 F (37.2 C)   TempSrc: Oral   Resp: 16 16  SpO2: 100% 96%    Wt Readings from Last 3 Encounters:  08/19/15 65.772 kg (145 lb)  08/12/15 65.772 kg (145 lb)  06/02/15 63.504 kg (140 lb)    General:  Appears calm and comfortable Eyes: PERRL, normal lids, irises & conjunctiva ENT: grossly normal hearing, lips & tongue Neck: no LAD, masses or thyromegaly Cardiovascular: RRR, no m/r/g. No LE edema. Telemetry: SR, no arrhythmias  Respiratory: CTA bilaterally, no w/r/r. Normal respiratory effort. Abdomen: Bowel sounds are hyperactive, mild distention, soft, positive tenderness in lower quadrants, no guarding, no rebound. Skin: no rash or induration seen on limited exam Musculoskeletal: grossly normal  tone BUE/BLE Psychiatric: grossly normal mood and affect, speech fluent and appropriate Neurologic: Awake, alert, oriented 4, grossly non-focal.          Labs on Admission:  Basic Metabolic Panel:  Recent Labs Lab 12/18/15 1808  NA 141  K 3.6  CL 101  CO2 26  GLUCOSE 120*  BUN 33*  CREATININE 0.98  CALCIUM 9.4   Liver Function Tests:  Recent Labs Lab 12/18/15 1808  AST 12*  ALT 11*  ALKPHOS 88  BILITOT 1.4*  PROT 6.9  ALBUMIN 3.5    Recent Labs Lab 12/18/15 1808  LIPASE 24   CBC:  Recent Labs Lab 12/18/15 1808  WBC 9.0  NEUTROABS 7.8*  HGB 9.9*  HCT 31.5*  MCV 83.8  PLT 285    Radiological Exams on Admission: Ct Abdomen Pelvis W Contrast  12/18/2015  CLINICAL DATA:  Generalized abdominal pain since Monday, intermittent diarrhea, vomiting, hypertension, history diverticulitis EXAM: CT ABDOMEN AND PELVIS WITH CONTRAST TECHNIQUE: Multidetector CT imaging of the abdomen and pelvis was performed  using the standard protocol following bolus administration of intravenous contrast. CONTRAST:  100mL ISOVUE-300 IOPAMIDOL (ISOVUE-300) INJECTION 61% IV. Dilute oral contrast. COMPARISON:  None FINDINGS: Dependent atelectasis at lung bases. Gallstones within gallbladder with question additional sludge layer dependently. Liver, spleen, pancreas, kidneys, and adrenal glands normal. Distended stomach and proximal small bowel loops with decompressed distal small bowel loops consistent with small bowel obstruction. Transition from dilated to nondilated small bowel occurs in RIGHT mid abdomen image 57 with question mild bowel wall thickening at site. Small bowel loops measure up to 5.6 cm diameter. Distal small bowel and colon decompressed. Sigmoid diverticulosis without evidence of diverticulitis. Several tiny foci of gas are seen adjacent to a small bowel loop in the mid abdomen, all likely within lumen or potentially a few tiny small bowel diverticula. No definite free intraperitoneal air or free fluid. Scattered atherosclerotic calcifications. Beam hardening artifacts from LEFT hip prosthesis obscure portions of pelvis. Bladder decompressed. Uterus surgically absent without definite visualization of the ovaries. Appendix not seen. No mass, hernia, or acute osseous findings. IMPRESSION: Mid small bowel obstruction without definite evidence of perforation. Questionable mild bowel wall thickening at the site of obstruction, could be related to inflammation, stricture, mass not excluded. Electronically Signed   By: Ulyses SouthwardMark  Boles M.D.   On: 12/18/2015 19:52      Assessment/Plan Principal Problem:   SBO (small bowel obstruction) (HCC) Admit to MedSurg. Keep nothing by mouth. Continue IV fluids. Analgesics as needed in small doses. Optimize electrolytes. Will try to induce bowel movement with Dulcolax 10 mg. Consider NG tube placement/suctioning and general surgery evaluation in the morning if no  improvement.  Active Problems:   Diverticulosis No signs of inflammation on CT scan of the abdomen and pelvis.   Migraine Asymptomatic at this time. Sumatriptan SQ when necessary.    Essential hypertension Resume losartan once the patient is tolerating oral intake. Monitor blood pressure.        Code Status: Full code. DVT Prophylaxis: SCDs. Family Communication:  Disposition Plan: Admit for IV hydration, keep nothing by mouth, treat constipation. May need NG tube suctioning if                             no improvement.  Time spent: About 70 minutes were used in the process of this admission.  Bobette Moavid Manuel Mariavictoria Nottingham, M.D. [facility] Pager [phone].

## 2015-12-18 NOTE — ED Notes (Signed)
Bed: [id number] Expected date:  Expected time:  Means of arrival:  Comments: EMS- [id number] F- abdominal pain/IV

## 2015-12-18 NOTE — ED Provider Notes (Signed)
CSN: [id number]     Arrival date & time 12/18/15  1734 History   First MD Initiated Contact with Patient 12/18/15 1744     Chief Complaint  Patient presents with  . Abdominal Pain     (Consider location/radiation/quality/duration/timing/severity/associated sxs/prior Treatment) HPI Comments: Patient here complaining of four-day history of bilateral lower abdominal pain with intermittent watery diarrhea and nausea as well as temperature up to 100.2. Denies any urinary symptoms. Does have a history of diverticulitis and this is similar. Has had long-standing bloody stools which have not changed. Denies any weakness with standing. Symptoms seem to wax and wane. Called EMS and was given 50 mg of phenol as well as 4 mg of Zofran prior to arrival which improved her pain.  Patient is a 75 y.o. female presenting with abdominal pain. The history is provided by the patient.  Abdominal Pain   Past Medical History  Diagnosis Date  . Diverticulitis   . Hypertension   . Anxiety   . Headache     migraines  . Arthritis    Past Surgical History  Procedure Laterality Date  . Hernia repair    . Abdominal hysterectomy      Partial  . Carpal tunnel release Right   . Bunionectomy Left   . Abdominal surgery due to barium enema prep - enlarged intestines which resulted in hernias.  has mesh in abdomen from incisional hernias    . Tonsillectomy    . 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 No pertinent family history. Social History  Substance Use Topics  . Smoking status: Never Smoker   . Smokeless tobacco: Never Used  . Alcohol Use: 0.0 oz/week    0 Standard drinks or equivalent per week     Comment: rarely   OB History    No data available     Review of Systems  Gastrointestinal: Positive for abdominal pain.  All other systems  reviewed and are negative.     Allergies  Sulfamethoxazole; Ciprofloxacin; Codeine; Ketorolac tromethamine; Latex; and Penicillins  Home Medications   Prior to Admission medications   Medication Sig Start Date End Date Taking? Authorizing Provider  acetaminophen (TYLENOL) 500 MG tablet Take 2 tablets (1,000 mg total) by mouth every 8 (eight) hours. 08/21/15   Lanney Gins, PA-C  ALPRAZolam Prudy Feeler) 0.25 MG tablet Take 1 tablet (0.25 mg total) by mouth 3 (three) times daily as needed for anxiety. 08/21/15   Lanney Gins, PA-C  Ascorbic Acid (VITAMIN C) 1000 MG tablet Take 4,000 mg by mouth 2 (two) times daily.     Historical Provider, MD  B Complex Vitamins (VITAMIN-B COMPLEX) TABS Take 1 tablet by mouth daily.     Historical Provider, MD  Cholecalciferol (VITAMIN D3) 1000 UNITS CAPS Take 4,000 Units by mouth daily.    Historical Provider, MD  DIGESTIVE ENZYMES PO Take 1 capsule by mouth 3 (three) times daily before meals.    Historical Provider, MD  docusate sodium (COLACE) 100 MG capsule Take 1 capsule (100 mg total) by mouth 2 (two) times daily. 08/21/15   Lanney Gins, PA-C  ECHINACEA PO Take 1 capsule by mouth 2 (two) times daily as needed (for cold symptoms).    Historical Provider, MD  ferrous sulfate 325 (65 FE) MG tablet Take 1 tablet (325 mg total) by mouth 3 (three) times daily  after meals. 08/21/15   Lanney Gins, PA-C  Homeopathic Products [facility] COLD REMEDY PO) Take 1 tablet by mouth every 4 (four) hours as needed (for cold symptoms).    Historical Provider, MD  losartan (COZAAR) 100 MG tablet Take 1 tablet (100 mg total) by mouth daily. 06/02/15   Elvina Sidle, MD  MAGNESIUM CITRATE PO Take 1,200 mg by mouth daily.    Historical Provider, MD  Menaquinone-7 (VITAMIN K2 PO) Take 80 mcg by mouth daily.    Historical Provider, MD  polyethylene glycol (MIRALAX / GLYCOLAX) packet Take 17 g by mouth 2 (two) times daily. 08/21/15   Lanney Gins, PA-C  Probiotic Product  (PROBIOTIC DAILY PO) Take 1 capsule by mouth 3 (three) times daily before meals.    Historical Provider, MD  SUMAtriptan (IMITREX) 100 MG tablet Take 1 tablet (100 mg total) by mouth every 2 (two) hours as needed for migraine. May take one tablet on the onset of headache. Then repeat in 2 hours as needed. Maximum 2 doses in 24 hours 08/25/15   Oneal Grout, MD  traMADol (ULTRAM) 50 MG tablet Take 1-2 tablets (50-100 mg total) by mouth every 6 (six) hours. 08/21/15   Lanney Gins, PA-C  Turmeric POWD Take 1,000 mg by mouth daily.     Historical Provider, MD  vitamin E (VITAMIN E) 400 UNIT capsule Take 800 Units by mouth daily as needed (for cold symptoms).    Historical Provider, MD   BP 152/100 mmHg  Pulse 84  Temp(Src) 98.9 F (37.2 C) (Oral)  Resp 16  SpO2 100%  LMP 04/04/2012 Physical Exam  Constitutional: She is oriented to person, place, and time. She appears well-developed and well-nourished.  Non-toxic appearance. No distress.  HENT:  Head: Normocephalic and atraumatic.  Eyes: Conjunctivae, EOM and lids are normal. Pupils are equal, round, and reactive to light.  Neck: Normal range of motion. Neck supple. No tracheal deviation present. No thyroid mass present.  Cardiovascular: Normal rate, regular rhythm and normal heart sounds.  Exam reveals no gallop.   No murmur heard. Pulmonary/Chest: Effort normal and breath sounds normal. No stridor. No respiratory distress. She has no decreased breath sounds. She has no wheezes. She has no rhonchi. She has no rales.  Abdominal: Soft. Normal appearance and bowel sounds are normal. She exhibits no distension. There is tenderness in the right lower quadrant and left lower quadrant. There is no rigidity, no rebound, no guarding and no CVA tenderness.    Musculoskeletal: Normal range of motion. She exhibits no edema or tenderness.  Neurological: She is alert and oriented to person, place, and time. She has normal strength. No cranial nerve  deficit or sensory deficit. GCS eye subscore is 4. GCS verbal subscore is 5. GCS motor subscore is 6.  Skin: Skin is warm and dry. No abrasion and no rash noted.  Psychiatric: She has a normal mood and affect. Her speech is normal and behavior is normal.  Nursing note and vitals reviewed.   ED Course  Procedures (including critical care time) Labs Review Labs Reviewed  URINE CULTURE  CBC WITH DIFFERENTIAL/PLATELET  COMPREHENSIVE METABOLIC PANEL  LIPASE, BLOOD  URINALYSIS, ROUTINE W REFLEX MICROSCOPIC (NOT AT [facility])    Imaging Review No results found. I have personally reviewed and evaluated these images and lab results as part of my medical decision-making.   EKG Interpretation None      MDM   Final diagnoses:  None    Patient will be admitted for treatment  of her small bowel obstruction    Lorre NickAnthony Agustus Mane, MD 12/18/15 2149

## 2015-12-19 ENCOUNTER — Inpatient Hospital Stay (HOSPITAL_COMMUNITY): Payer: Medicare Other

## 2015-12-19 DIAGNOSIS — K5791 Diverticulosis of intestine, part unspecified, without perforation or abscess with bleeding: Secondary | ICD-10-CM

## 2015-12-19 DIAGNOSIS — K5790 Diverticulosis of intestine, part unspecified, without perforation or abscess without bleeding: Secondary | ICD-10-CM | POA: Diagnosis present

## 2015-12-19 DIAGNOSIS — I10 Essential (primary) hypertension: Secondary | ICD-10-CM | POA: Diagnosis present

## 2015-12-19 LAB — CBC WITH DIFFERENTIAL/PLATELET
BASOS ABS: 0 10*3/uL (ref 0.0–0.1)
BASOS PCT: 0 %
EOS ABS: 0.1 10*3/uL (ref 0.0–0.7)
EOS PCT: 3 %
HCT: 27.1 % — ABNORMAL LOW (ref 36.0–46.0)
Hemoglobin: 8.4 g/dL — ABNORMAL LOW (ref 12.0–15.0)
Lymphocytes Relative: 7 %
Lymphs Abs: 0.4 10*3/uL — ABNORMAL LOW (ref 0.7–4.0)
MCH: 26 pg (ref 26.0–34.0)
MCHC: 31 g/dL (ref 30.0–36.0)
MCV: 83.9 fL (ref 78.0–100.0)
Monocytes Absolute: 1 10*3/uL (ref 0.1–1.0)
Monocytes Relative: 21 %
NEUTROS PCT: 69 %
Neutro Abs: 3.2 10*3/uL (ref 1.7–7.7)
PLATELETS: 240 10*3/uL (ref 150–400)
RBC: 3.23 MIL/uL — AB (ref 3.87–5.11)
RDW: 15.9 % — ABNORMAL HIGH (ref 11.5–15.5)
WBC: 4.7 10*3/uL (ref 4.0–10.5)

## 2015-12-19 LAB — COMPREHENSIVE METABOLIC PANEL
ALBUMIN: 2.8 g/dL — AB (ref 3.5–5.0)
ALT: 9 U/L — AB (ref 14–54)
AST: 11 U/L — ABNORMAL LOW (ref 15–41)
Alkaline Phosphatase: 73 U/L (ref 38–126)
Anion gap: 7 (ref 5–15)
BUN: 26 mg/dL — ABNORMAL HIGH (ref 6–20)
CHLORIDE: 108 mmol/L (ref 101–111)
CO2: 29 mmol/L (ref 22–32)
CREATININE: 0.84 mg/dL (ref 0.44–1.00)
Calcium: 8.5 mg/dL — ABNORMAL LOW (ref 8.9–10.3)
GFR calc non Af Amer: >60 mL/min (ref >60)
GLUCOSE: 122 mg/dL — AB (ref 65–99)
Potassium: 3.6 mmol/L (ref 3.5–5.1)
SODIUM: 144 mmol/L (ref 135–145)
Total Bilirubin: 0.5 mg/dL (ref 0.3–1.2)
Total Protein: 5.7 g/dL — ABNORMAL LOW (ref 6.5–8.1)

## 2015-12-19 MED ORDER — POTASSIUM PHOSPHATES 15 MMOLE/5ML IV SOLN
20.0000 mmol | Freq: Once | INTRAVENOUS | Status: AC
  Administered 2015-12-19: 20 mmol via INTRAVENOUS
  Filled 2015-12-19: qty 6.67

## 2015-12-19 MED ORDER — MORPHINE SULFATE (PF) 2 MG/ML IV SOLN
2.0000 mg | INTRAVENOUS | Status: DC | PRN
  Administered 2015-12-19 – 2015-12-23 (×5): 2 mg via INTRAVENOUS
  Filled 2015-12-19 (×6): qty 1

## 2015-12-19 MED ORDER — LORAZEPAM 2 MG/ML IJ SOLN
1.0000 mg | Freq: Once | INTRAMUSCULAR | Status: AC
  Administered 2015-12-19: 1 mg via INTRAVENOUS
  Filled 2015-12-19: qty 1

## 2015-12-19 MED ORDER — SUMATRIPTAN SUCCINATE 100 MG PO TABS
100.0000 mg | ORAL_TABLET | ORAL | Status: DC | PRN
  Administered 2015-12-19: 100 mg via ORAL
  Filled 2015-12-19 (×2): qty 1

## 2015-12-19 MED ORDER — ONDANSETRON HCL 4 MG/2ML IJ SOLN
4.0000 mg | Freq: Four times a day (QID) | INTRAMUSCULAR | Status: DC | PRN
  Administered 2015-12-19 – 2015-12-23 (×5): 4 mg via INTRAVENOUS
  Filled 2015-12-19 (×5): qty 2

## 2015-12-19 MED ORDER — ONDANSETRON HCL 4 MG PO TABS: 4.0000 mg | ORAL_TABLET | Freq: Four times a day (QID) | ORAL | Status: DC | PRN

## 2015-12-19 MED ORDER — BISACODYL 10 MG RE SUPP
10.0000 mg | Freq: Every day | RECTAL | Status: DC | PRN
  Administered 2015-12-19: 10 mg via RECTAL
  Filled 2015-12-19 (×2): qty 1

## 2015-12-19 NOTE — Progress Notes (Signed)
[facility]                                                                              Patient Demographics  Melinda Washington, is a 75 y.o. female, DOB - [birth year], ZOX:[id number]  Admit date - 12/18/2015   Admitting Physician Bobette Mo, MD  Outpatient Primary MD for the patient is Elvina Sidle, MD  LOS - 1   Chief Complaint  Patient presents with  . Abdominal Pain      HPI on 12/18/2015 by Dr. Sanda Klein Melinda Washington is a 75 y.o. female with a past medical history of diverticulosis, hypertension, anxiety, migraine headaches, osteoarthritis, and the emergency department with a history of abdominal pain since Monday, associated with constipation since Sunday. Patient stated that she had an an episode of self-induced vomiting in the morning, which helped, but only for a while. She is states that the pain is similar to her episodes of diverticulitis and was relieved by fentanyl and Zofran given by EMS.   Imaging done in the emergency department shows a mid small bowel obstruction without any evidence of perforation.  Assessment & Plan   Abdominal pain secondary to Small bowel obstruction -CT abdomen and pelvis showed mid small bowel obstruction without definite evidence of perforation. Questionable mild bowel wall thickening at the site of obstruction could be related to inflammation, stricture, mass not excluded -Patient does have active bowel sounds. -Continue conservative management, IV fluids, pain medications -Will obtain abdominal x-ray.  History of diverticulosis -No signs of inflammation or active diverticulitis on CT of the abdomen pelvis  Migraine headaches -Continue Imitrex  Essential hypertension -Losartan currently held -Continue to monitor, will order medications as needed  Normocytic anemia -Iron supplementation currently held -hemoglobin currently 8.4, suspect drop secondary to dilutional component -Continue to monitor CBC  Code  Status: Full  Family Communication: None at bedside  Disposition Plan: Admitted.  Time Spent in minutes   30 minutes  Procedures  None  Consults   None  DVT Prophylaxis  SCDs  Lab Results  Component Value Date   PLT 240 12/19/2015    Medications  Scheduled Meds:  Continuous Infusions: . 0.9 % NaCl with KCl 20 mEq / L 100 mL/hr at 12/19/15 1349   PRN Meds:.bisacodyl, morphine injection, ondansetron **OR** ondansetron (ZOFRAN) IV, SUMAtriptan  Antibiotics    Anti-infectives    None      Subjective:   Melinda Washington seen and examined today. Patient complains of headache.  States she has not had a bowel movement in days.  Admits to making herself vomit at home as she thought it would make her feel better.  Feels abdominal pain is improving mildly.  Denies chest pain, shortness of breath, current nausea or vomiting.    Objective:   Filed Vitals:   12/18/15 1744 12/18/15 2023 12/18/15 2305 12/19/15 0440  BP: 152/100 151/99 153/95 125/84  Pulse: 84 86 83 87  Temp: 98.9 F (37.2 C)  99.1 F (37.3 C) 98.5 F (36.9 C)  TempSrc: Oral  Oral Oral  Resp: Height:   5' 3.5" (1.613 m)   Weight:   66.089 kg (145 lb 11.2  oz)   SpO2: 100% 96% 96% 96%    Wt Readings from Last 3 Encounters:  12/18/15 66.089 kg (145 lb 11.2 oz)  08/19/15 65.772 kg (145 lb)  08/12/15 65.772 kg (145 lb)     Intake/Output Summary (Last 24 hours) at 12/19/15 1400 Last data filed at 12/19/15 0644  Gross per 24 hour  Intake 363.33 ml  Output      0 ml  Net 363.33 ml    Exam  General: Well developed, well nourished, NAD, appears stated age  HEENT: NCAT, PERRLA, EOMI, Anicteic Sclera, mucous membranes moist.   Neck: Supple, no JVD, no masses  Cardiovascular: S1 S2 auscultated, no rubs, murmurs or gallops. Regular rate and rhythm.  Respiratory: Clear to auscultation bilaterally with equal chest rise  Abdomen: Soft, nontender, nondistended, + bowel  sounds  Extremities: warm dry without cyanosis clubbing or edema  Neuro: AAOx3, cranial nerves grossly intact. Strength 5/5 in patient's upper and lower extremities bilaterally  Skin: Without rashes exudates or nodules  Psych: Normal affect and demeanor with intact judgement and insight  Data Review   Micro Results No results found for this or any previous visit (from the past 240 hour(s)).  Radiology Reports Ct Abdomen Pelvis W Contrast  12/18/2015  CLINICAL DATA:  Generalized abdominal pain since Monday, intermittent diarrhea, vomiting, hypertension, history diverticulitis EXAM: CT ABDOMEN AND PELVIS WITH CONTRAST TECHNIQUE: Multidetector CT imaging of the abdomen and pelvis was performed using the standard protocol following bolus administration of intravenous contrast. CONTRAST:  ISOVUE-300 IOPAMIDOL (ISOVUE-300) INJECTION 61% IV. Dilute oral contrast. COMPARISON:  None FINDINGS: Dependent atelectasis at lung bases. Gallstones within gallbladder with question additional sludge layer dependently. Liver, spleen, pancreas, kidneys, and adrenal glands normal. Distended stomach and proximal small bowel loops with decompressed distal small bowel loops consistent with small bowel obstruction. Transition from dilated to nondilated small bowel occurs in RIGHT mid abdomen image 57 with question mild bowel wall thickening at site. Small bowel loops measure up to 5.6 cm diameter. Distal small bowel and colon decompressed. Sigmoid diverticulosis without evidence of diverticulitis. Several tiny foci of gas are seen adjacent to a small bowel loop in the mid abdomen, all likely within lumen or potentially a few tiny small bowel diverticula. No definite free intraperitoneal air or free fluid. Scattered atherosclerotic calcifications. Beam hardening artifacts from LEFT hip prosthesis obscure portions of pelvis. Bladder decompressed. Uterus surgically absent without definite visualization of the ovaries.  Appendix not seen. No mass, hernia, or acute osseous findings. IMPRESSION: Mid small bowel obstruction without definite evidence of perforation. Questionable mild bowel wall thickening at the site of obstruction, could be related to inflammation, stricture, mass not excluded. Electronically Signed   By: Ulyses Southward M.D.   On: 12/18/2015 19:52    CBC  Recent Labs Lab 12/18/15 1808 12/19/15 0519  WBC 9.0 4.7  HGB 9.9* 8.4*  HCT 31.5* 27.1*  PLT 285 240  MCV 83.8 83.9  MCH 26.3 26.0  MCHC 31.4 31.0  RDW 15.9* 15.9*  LYMPHSABS 0.7 0.4*  MONOABS 0.5 1.0  EOSABS 0.0 0.1  BASOSABS 0.0 0.0    Chemistries   Recent Labs Lab 12/18/15 1808 12/18/15 1825 12/19/15 0519  NA 141  --  144  K 3.6  --  3.6  CL 101  --  108  CO2 26  --  29  GLUCOSE 120*  --  122*  BUN 33*  --  26*  CREATININE 0.98  --  0.84  CALCIUM  9.4  --  8.5*  MG  --  2.4  --   AST 12*  --  11*  ALT 11*  --  9*  ALKPHOS 88  --  73  BILITOT 1.4*  --  0.5   ------------------------------------------------------------------------------------------------------------------ estimated creatinine clearance is 56 mL/min (by C-G formula based on Cr of 0.84). ------------------------------------------------------------------------------------------------------------------ No results for input(s): HGBA1C in the last 72 hours. ------------------------------------------------------------------------------------------------------------------ No results for input(s): CHOL, HDL, LDLCALC, TRIG, CHOLHDL, LDLDIRECT in the last 72 hours. ------------------------------------------------------------------------------------------------------------------ No results for input(s): TSH, T4TOTAL, T3FREE, THYROIDAB in the last 72 hours.  Invalid input(s): FREET3 ------------------------------------------------------------------------------------------------------------------ No results for input(s): VITAMINB12, FOLATE, FERRITIN, TIBC, IRON,  RETICCTPCT in the last 72 hours.  Coagulation profile No results for input(s): INR, PROTIME in the last 168 hours.  No results for input(s): DDIMER in the last 72 hours.  Cardiac Enzymes No results for input(s): CKMB, TROPONINI, MYOGLOBIN in the last 168 hours.  Invalid input(s): CK ------------------------------------------------------------------------------------------------------------------ Invalid input(s): POCBNP    Aarin Bluett D.O. on 12/19/2015 at 2:00 PM  Between 7am to 7pm - Pager - [phone]  After 7pm go to [email or web address] - password [email or web address]  And look for the night coverage person covering for me after hours  [facility] Office  [phone]

## 2015-12-19 NOTE — Care Management Note (Signed)
Case Management Note  Patient Details  Name: Melinda Washington MRN: [id number] Date of Birth: Aug 11, 1941  Subjective/Objective:                 Post partum abd pain and incisional drainage from c section site   Action/Plan:Date:  December 19, 2015 Chart reviewed for concurrent status and case management needs. Will continue to follow patient for changes and needs: Marcelle Smilinghonda Lesh, BSN, RN, [place]   [phone]  Expected Discharge Date:  12/26/15               Expected Discharge Plan:  Home/Self Care  In-House Referral:  NA  Discharge planning Services  CM Consult  Post Acute Care Choice:  NA Choice offered to:  NA  DME Arranged:    DME Agency:     HH Arranged:    HH Agency:     Status of Service:  Completed, signed off  Medicare Important Message Given:    Date Medicare IM Given:    Medicare IM give by:    Date Additional Medicare IM Given:    Additional Medicare Important Message give by:     If discussed at Long Length of Stay Meetings, dates discussed:    Additional Comments:  Golda AcreDavis, Rhonda Lynn, RN 12/19/2015, 10:59 AM

## 2015-12-20 LAB — COMPREHENSIVE METABOLIC PANEL
ALBUMIN: 3 g/dL — AB (ref 3.5–5.0)
ALK PHOS: 73 U/L (ref 38–126)
ALT: 14 U/L (ref 14–54)
ANION GAP: 11 (ref 5–15)
AST: 17 U/L (ref 15–41)
BUN: 15 mg/dL (ref 6–20)
CALCIUM: 8.7 mg/dL — AB (ref 8.9–10.3)
CO2: 21 mmol/L — AB (ref 22–32)
Chloride: 110 mmol/L (ref 101–111)
Creatinine, Ser: 0.75 mg/dL (ref 0.44–1.00)
GFR calc non Af Amer: >60 mL/min (ref >60)
GLUCOSE: 70 mg/dL (ref 65–99)
POTASSIUM: 4.2 mmol/L (ref 3.5–5.1)
SODIUM: 142 mmol/L (ref 135–145)
TOTAL PROTEIN: 6.1 g/dL — AB (ref 6.5–8.1)
Total Bilirubin: 0.9 mg/dL (ref 0.3–1.2)

## 2015-12-20 LAB — CBC WITH DIFFERENTIAL/PLATELET
BASOS PCT: 1 %
Basophils Absolute: 0 10*3/uL (ref 0.0–0.1)
Eosinophils Absolute: 0.1 10*3/uL (ref 0.0–0.7)
Eosinophils Relative: 3 %
HCT: 31 % — ABNORMAL LOW (ref 36.0–46.0)
Hemoglobin: 9.6 g/dL — ABNORMAL LOW (ref 12.0–15.0)
LYMPHS PCT: 18 %
Lymphs Abs: 0.7 10*3/uL (ref 0.7–4.0)
MCH: 26.7 pg (ref 26.0–34.0)
MCHC: 31 g/dL (ref 30.0–36.0)
MCV: 86.4 fL (ref 78.0–100.0)
MONOS PCT: 21 %
Monocytes Absolute: 0.8 10*3/uL (ref 0.1–1.0)
NEUTROS ABS: 2.3 10*3/uL (ref 1.7–7.7)
NEUTROS PCT: 57 %
Platelets: 288 10*3/uL (ref 150–400)
RBC: 3.59 MIL/uL — ABNORMAL LOW (ref 3.87–5.11)
RDW: 15.8 % — AB (ref 11.5–15.5)
WBC: 4 10*3/uL (ref 4.0–10.5)

## 2015-12-20 LAB — URINE CULTURE: CULTURE: NO GROWTH

## 2015-12-20 MED ORDER — LORAZEPAM 2 MG/ML IJ SOLN
0.5000 mg | Freq: Once | INTRAMUSCULAR | Status: AC
  Administered 2015-12-20: 0.5 mg via INTRAVENOUS
  Filled 2015-12-20: qty 1

## 2015-12-20 NOTE — Progress Notes (Signed)
[facility]                                                                              Patient Demographics  Melinda Washington, is a 75 y.o. female, DOB - 03/11/1941, ZOX:[id number]  Admit date - 12/18/2015   Admitting Physician Bobette Mo, MD  Outpatient Primary MD for the patient is Elvina Sidle, MD  LOS - 2   Chief Complaint  Patient presents with  . Abdominal Pain      HPI on 12/18/2015 by Dr. Sanda Klein Melinda Washington is a 74 y.o. female with a past medical history of diverticulosis, hypertension, anxiety, migraine headaches, osteoarthritis, and the emergency department with a history of abdominal pain since Monday, associated with constipation since Sunday. Patient stated that she had an an episode of self-induced vomiting in the morning, which helped, but only for a while. She is states that the pain is similar to her episodes of diverticulitis and was relieved by fentanyl and Zofran given by EMS.   Imaging done in the emergency department shows a mid small bowel obstruction without any evidence of perforation.  Assessment & Plan   Abdominal pain secondary to Small bowel obstruction -CT abdomen and pelvis showed mid small bowel obstruction without definite evidence of perforation. Questionable mild bowel wall thickening at the site of obstruction could be related to inflammation, stricture, mass not excluded -Continue conservative management, IV fluids, pain medications -Abd xray 12/19/15: Small bowel obstruction, no free air -Patient does have active bowel sounds. No nausea or vomiting -Started patient on clear liquids  History of diverticulosis -No signs of inflammation or active diverticulitis on CT of the abdomen pelvis  Migraine headaches -Continue Imitrex  Essential hypertension -Losartan currently held -Continue to monitor, will order medications as needed  Normocytic anemia -Iron supplementation currently held -hemoglobin currently 9.6,  suspect drop secondary to dilutional component -Continue to monitor CBC  Code Status: Full  Family Communication: None at bedside  Disposition Plan: Admitted. Continue to monitor  Time Spent in minutes   30 minutes  Procedures  None  Consults   None  DVT Prophylaxis  SCDs  Lab Results  Component Value Date   PLT 288 12/20/2015    Medications  Scheduled Meds:  Continuous Infusions: . 0.9 % NaCl with KCl 20 mEq / L 100 mL/hr at 12/19/15 1845   PRN Meds:.bisacodyl, morphine injection, ondansetron **OR** ondansetron (ZOFRAN) IV, SUMAtriptan  Antibiotics    Anti-infectives    None      Subjective:   Melinda Washington seen and examined today. Feels mild abdominal pain has mildly improved. Denies chest pain, shortness of breath, current nausea or vomiting.   Would like to have clear liquids.  Objective:   Filed Vitals:   12/18/15 2305 12/19/15 0440 12/19/15 1500 12/19/15 2041  BP: 153/95 125/84 134/84 152/93  Pulse: 83 87 80 84  Temp: 99.1 F (37.3 C) 98.5 F (36.9 C) 98.4 F (36.9 C) 98.7 F (37.1 C)  TempSrc: Oral Oral Oral Oral  Resp: Height: 5' 3.5" (1.613 m)     Weight: 66.089 kg (145 lb 11.2 oz)     SpO2: 96% 96% 98%  98%    Wt Readings from Last 3 Encounters:  12/18/15 66.089 kg (145 lb 11.2 oz)  08/19/15 65.772 kg (145 lb)  08/12/15 65.772 kg (145 lb)     Intake/Output Summary (Last 24 hours) at 12/20/15 1410 Last data filed at 12/20/15 0600  Gross per 24 hour  Intake   2300 ml  Output      0 ml  Net   2300 ml    Exam  General: Well developed, well nourished, NAD  HEENT: NCAT,mucous membranes moist.   Cardiovascular: S1 S2 auscultated, RRR, no murmurs  Respiratory: Clear to auscultation bilaterally   Abdomen: Soft, mild TTP mid abdomen, nondistended, + bowel sounds  Extremities: warm dry without cyanosis clubbing or edema  Neuro: AAOx3, nonfocal  Psych: Normal affect and demeanor  Data Review   Micro  Results No results found for this or any previous visit (from the past 240 hour(s)).  Radiology Reports Ct Abdomen Pelvis W Contrast  12/18/2015  CLINICAL DATA:  Generalized abdominal pain since Monday, intermittent diarrhea, vomiting, hypertension, history diverticulitis EXAM: CT ABDOMEN AND PELVIS WITH CONTRAST TECHNIQUE: Multidetector CT imaging of the abdomen and pelvis was performed using the standard protocol following bolus administration of intravenous contrast. CONTRAST:  100mL ISOVUE-300 IOPAMIDOL (ISOVUE-300) INJECTION 61% IV. Dilute oral contrast. COMPARISON:  None FINDINGS: Dependent atelectasis at lung bases. Gallstones within gallbladder with question additional sludge layer dependently. Liver, spleen, pancreas, kidneys, and adrenal glands normal. Distended stomach and proximal small bowel loops with decompressed distal small bowel loops consistent with small bowel obstruction. Transition from dilated to nondilated small bowel occurs in RIGHT mid abdomen image 57 with question mild bowel wall thickening at site. Small bowel loops measure up to 5.6 cm diameter. Distal small bowel and colon decompressed. Sigmoid diverticulosis without evidence of diverticulitis. Several tiny foci of gas are seen adjacent to a small bowel loop in the mid abdomen, all likely within lumen or potentially a few tiny small bowel diverticula. No definite free intraperitoneal air or free fluid. Scattered atherosclerotic calcifications. Beam hardening artifacts from LEFT hip prosthesis obscure portions of pelvis. Bladder decompressed. Uterus surgically absent without definite visualization of the ovaries. Appendix not seen. No mass, hernia, or acute osseous findings. IMPRESSION: Mid small bowel obstruction without definite evidence of perforation. Questionable mild bowel wall thickening at the site of obstruction, could be related to inflammation, stricture, mass not excluded. Electronically Signed   By: Ulyses SouthwardMark  Boles M.D.    On: 12/18/2015 19:52   Dg Abd 2 Views  12/19/2015  CLINICAL DATA:  Bowel obstruction EXAM: ABDOMEN - 2 VIEW COMPARISON:  CT abdomen and pelvis December 18, 2015 FINDINGS: Supine and upright images obtained. There are multiple loops of dilated bowel with multiple air-fluid levels consistent with persistent obstruction. No free air. There is atelectatic change in the left lung base. 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 IMPRESSION: Evidence of small bowel obstruction.  No free air. These results will be called to the ordering clinician or representative by the Radiologist Assistant, and communication documented in the PACS or zVision Dashboard. Electronically Signed   By: Bretta BangWilliam  Woodruff III M.D.   On: 12/19/2015 14:54    CBC  Recent Labs Lab 12/18/15 1808 12/19/15 0519 12/20/15 0531  WBC 9.0 4.7 4.0  HGB 9.9* 8.4* 9.6*  HCT 31.5* 27.1* 31.0*  PLT 285 240 288  MCV 83.8 83.9 86.4  MCH 26.3 26.0 26.7  MCHC 31.4 31.0 31.0  RDW 15.9* 15.9* 15.8*  LYMPHSABS 0.7 0.4* 0.7  MONOABS 0.5 1.0 0.8  EOSABS 0.0 0.1 0.1  BASOSABS 0.0 0.0 0.0    Chemistries   Recent Labs Lab 12/18/15 1808 12/18/15 1825 12/19/15 0519 12/20/15 0531  NA 141  --  144 142  K 3.6  --  3.6 4.2  CL 101  --  108 110  CO2 26  --  29 21*  GLUCOSE 120*  --  122* 70  BUN 33*  --  26* 15  CREATININE 0.98  --  0.84 0.75  CALCIUM 9.4  --  8.5* 8.7*  MG  --  2.4  --   --   AST 12*  --  11* 17  ALT 11*  --  9* 14  ALKPHOS 88  --  73 73  BILITOT 1.4*  --  0.5 0.9   ------------------------------------------------------------------------------------------------------------------ estimated creatinine clearance is 58.8 mL/min (by C-G formula based on Cr of 0.75). ------------------------------------------------------------------------------------------------------------------ No results for input(s): HGBA1C in the last 72  hours. ------------------------------------------------------------------------------------------------------------------ No results for input(s): CHOL, HDL, LDLCALC, TRIG, CHOLHDL, LDLDIRECT in the last 72 hours. ------------------------------------------------------------------------------------------------------------------ No results for input(s): TSH, T4TOTAL, T3FREE, THYROIDAB in the last 72 hours.  Invalid input(s): FREET3 ------------------------------------------------------------------------------------------------------------------ No results for input(s): VITAMINB12, FOLATE, FERRITIN, TIBC, IRON, RETICCTPCT in the last 72 hours.  Coagulation profile No results for input(s): INR, PROTIME in the last 168 hours.  No results for input(s): DDIMER in the last 72 hours.  Cardiac Enzymes No results for input(s): CKMB, TROPONINI, MYOGLOBIN in the last 168 hours.  Invalid input(s): CK ------------------------------------------------------------------------------------------------------------------ Invalid input(s): POCBNP    Anvika Gashi D.O. on 12/20/2015 at 2:10 PM  Between 7am to 7pm - Pager - [phone]  After 7pm go to [email or web address] - password [email or web address]  And look for the night coverage person covering for me after hours  [facility] Office  [phone]

## 2015-12-21 ENCOUNTER — Inpatient Hospital Stay (HOSPITAL_COMMUNITY): Payer: Medicare Other

## 2015-12-21 MED ORDER — LORAZEPAM 2 MG/ML IJ SOLN
0.5000 mg | Freq: Once | INTRAMUSCULAR | Status: AC
  Administered 2015-12-21: 0.5 mg via INTRAVENOUS
  Filled 2015-12-21: qty 1

## 2015-12-21 MED ORDER — ALUM & MAG HYDROXIDE-SIMETH 200-200-20 MG/5ML PO SUSP
30.0000 mL | Freq: Once | ORAL | Status: DC
  Filled 2015-12-21: qty 30

## 2015-12-21 NOTE — Progress Notes (Signed)
[facility]                                                                              Patient Demographics  Melinda Washington, is a 75 y.o. female, DOB - Dec 12, 1940, [id number]  Admit date - 12/18/2015   Admitting Physician Bobette Mo, MD  Outpatient Primary MD for the patient is Elvina Sidle, MD  LOS - 3   Chief Complaint  Patient presents with  . Abdominal Pain      HPI on 12/18/2015 by Dr. Sanda Klein Melinda Washington is a 75 y.o. female with a past medical history of diverticulosis, hypertension, anxiety, migraine headaches, osteoarthritis, and the emergency department with a history of abdominal pain since Monday, associated with constipation since Sunday. Patient stated that she had an an episode of self-induced vomiting in the morning, which helped, but only for a while. She is states that the pain is similar to her episodes of diverticulitis and was relieved by fentanyl and Zofran given by EMS.  Imaging done in the emergency department shows a mid small bowel obstruction without any evidence of perforation.  Interim history Patient has bowel sounds with passing of flatus. Started on clear liquids. Continue to monitor.  Assessment & Plan   Abdominal pain secondary to Small bowel obstruction -CT abdomen and pelvis showed mid small bowel obstruction without definite evidence of perforation. Questionable mild bowel wall thickening at the site of obstruction could be related to inflammation, stricture, mass not excluded -Continue conservative management, IV fluids, pain medications -Abd xray 12/19/15: Small bowel obstruction, no free air -Patient does have active bowel sounds. No nausea or vomiting. +flatus -Spoke with general surgery, Dr. Luisa Hart, who suggested clear liquids and monitor -Started patient on clear liquids -Will order repeat Abd Xray  History of diverticulosis -No signs of inflammation or active diverticulitis on CT of the abdomen  pelvis  Migraine headaches -Continue Imitrex  Essential hypertension -Losartan currently held -Continue to monitor, will order medications as needed  Normocytic anemia -Iron supplementation currently held -hemoglobin currently 9.6, suspect drop secondary to dilutional component -Continue to monitor CBC  Code Status: Full  Family Communication: None at bedside  Disposition Plan: Admitted. Continue to monitor. Obtain abd xray.   Time Spent in minutes   30 minutes  Procedures  None  Consults   General surgery, Dr. Luisa Hart (in passing)  DVT Prophylaxis  SCDs  Lab Results  Component Value Date   PLT 288 12/20/2015    Medications  Scheduled Meds:  Continuous Infusions: . 0.9 % NaCl with KCl 20 mEq / L 100 mL/hr at 12/20/15 2128   PRN Meds:.bisacodyl, morphine injection, ondansetron **OR** ondansetron (ZOFRAN) IV, SUMAtriptan  Antibiotics    Anti-infectives    None      Subjective:   Melinda Washington seen and examined today. Able to tolerate liquid diet.  Did have some gas passage.  Denied nausea or vomiting, but did feel some bloating.  Denies chest pain, shortness of breath.  Objective:   Filed Vitals:   12/19/15 2041 12/20/15 1500 12/20/15 2055 12/21/15 0611  BP: 152/93 148/74 158/91 152/97  Pulse: 84 81 80 87  Temp: 98.7 F (37.1 C) 98.2 F (  36.8 C) 98.7 F (37.1 C) 98.4 F (36.9 C)  TempSrc: Oral Oral Oral Oral  Resp: Height:      Weight:      SpO2: 98% 99% 100% 98%    Wt Readings from Last 3 Encounters:  12/18/15 66.089 kg (145 lb 11.2 oz)  08/19/15 65.772 kg (145 lb)  08/12/15 65.772 kg (145 lb)     Intake/Output Summary (Last 24 hours) at 12/21/15 1237 Last data filed at 12/21/15 0200  Gross per 24 hour  Intake   2260 ml  Output      0 ml  Net   2260 ml    Exam  General: Well developed, well nourished, NAD  HEENT: NCAT,mucous membranes moist.   Cardiovascular: S1 S2 auscultated, RRR, no murmurs  Respiratory:  Clear to auscultation bilaterally   Abdomen: Soft, mild TTP mid abdomen, nondistended, + bowel sounds  Extremities: warm dry without cyanosis clubbing or edema  Neuro: AAOx3, nonfocal  Psych: Normal affect and demeanor, pleasant  Data Review   Micro Results Recent Results (from the past 240 hour(s))  Urine culture     Status: None   Collection Time: 12/18/15  6:00 PM  Result Value Ref Range Status   Specimen Description URINE, CLEAN CATCH  Final   Special Requests NONE  Final   Culture   Final    NO GROWTH 1 DAY Performed at [facility]    Report Status 12/20/2015 FINAL  Final    Radiology Reports Ct Abdomen Pelvis W Contrast  12/18/2015  CLINICAL DATA:  Generalized abdominal pain since Monday, intermittent diarrhea, vomiting, hypertension, history diverticulitis EXAM: CT ABDOMEN AND PELVIS WITH CONTRAST TECHNIQUE: Multidetector CT imaging of the abdomen and pelvis was performed using the standard protocol following bolus administration of intravenous contrast. CONTRAST:  ISOVUE-300 IOPAMIDOL (ISOVUE-300) INJECTION 61% IV. Dilute oral contrast. COMPARISON:  None FINDINGS: Dependent atelectasis at lung bases. Gallstones within gallbladder with question additional sludge layer dependently. Liver, spleen, pancreas, kidneys, and adrenal glands normal. Distended stomach and proximal small bowel loops with decompressed distal small bowel loops consistent with small bowel obstruction. Transition from dilated to nondilated small bowel occurs in RIGHT mid abdomen image 57 with question mild bowel wall thickening at site. Small bowel loops measure up to 5.6 cm diameter. Distal small bowel and colon decompressed. Sigmoid diverticulosis without evidence of diverticulitis. Several tiny foci of gas are seen adjacent to a small bowel loop in the mid abdomen, all likely within lumen or potentially a few tiny small bowel diverticula. No definite free intraperitoneal air or free fluid.  Scattered atherosclerotic calcifications. Beam hardening artifacts from LEFT hip prosthesis obscure portions of pelvis. Bladder decompressed. Uterus surgically absent without definite visualization of the ovaries. Appendix not seen. No mass, hernia, or acute osseous findings. IMPRESSION: Mid small bowel obstruction without definite evidence of perforation. Questionable mild bowel wall thickening at the site of obstruction, could be related to inflammation, stricture, mass not excluded. Electronically Signed   By: Ulyses Southward M.D.   On: 12/18/2015 19:52   Dg Abd 2 Views  12/19/2015  CLINICAL DATA:  Bowel obstruction EXAM: ABDOMEN - 2 VIEW COMPARISON:  CT abdomen and pelvis December 18, 2015 FINDINGS: Supine and upright images obtained. There are multiple loops of dilated bowel with multiple air-fluid levels consistent with persistent obstruction. No free air. There is atelectatic change in the left lung base. 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 IMPRESSION: Evidence of  small bowel obstruction.  No free air. These results will be called to the ordering clinician or representative by the Radiologist Assistant, and communication documented in the PACS or zVision Dashboard. Electronically Signed   By: Bretta BangWilliam  Woodruff III M.D.   On: 12/19/2015 14:54    CBC  Recent Labs Lab 12/18/15 1808 12/19/15 0519 12/20/15 0531  WBC 9.0 4.7 4.0  HGB 9.9* 8.4* 9.6*  HCT 31.5* 27.1* 31.0*  PLT 285 240 288  MCV 83.8 83.9 86.4  MCH 26.3 26.0 26.7  MCHC 31.4 31.0 31.0  RDW 15.9* 15.9* 15.8*  LYMPHSABS 0.7 0.4* 0.7  MONOABS 0.5 1.0 0.8  EOSABS 0.0 0.1 0.1  BASOSABS 0.0 0.0 0.0    Chemistries   Recent Labs Lab 12/18/15 1808 12/18/15 1825 12/19/15 0519 12/20/15 0531  NA 141  --  144 142  K 3.6  --  3.6 4.2  CL 101  --  108 110  CO2 26  --  29 21*  GLUCOSE 120*  --  122* 70  BUN 33*  --  26* 15  CREATININE 0.98  --  0.84 0.75  CALCIUM 9.4  --  8.5* 8.7*  MG  --  2.4  --   --   AST 12*  --  11*  17  ALT 11*  --  9* 14  ALKPHOS 88  --  73 73  BILITOT 1.4*  --  0.5 0.9   ------------------------------------------------------------------------------------------------------------------ estimated creatinine clearance is 58.8 mL/min (by C-G formula based on Cr of 0.75). ------------------------------------------------------------------------------------------------------------------ No results for input(s): HGBA1C in the last 72 hours. ------------------------------------------------------------------------------------------------------------------ No results for input(s): CHOL, HDL, LDLCALC, TRIG, CHOLHDL, LDLDIRECT in the last 72 hours. ------------------------------------------------------------------------------------------------------------------ No results for input(s): TSH, T4TOTAL, T3FREE, THYROIDAB in the last 72 hours.  Invalid input(s): FREET3 ------------------------------------------------------------------------------------------------------------------ No results for input(s): VITAMINB12, FOLATE, FERRITIN, TIBC, IRON, RETICCTPCT in the last 72 hours.  Coagulation profile No results for input(s): INR, PROTIME in the last 168 hours.  No results for input(s): DDIMER in the last 72 hours.  Cardiac Enzymes No results for input(s): CKMB, TROPONINI, MYOGLOBIN in the last 168 hours.  Invalid input(s): CK ------------------------------------------------------------------------------------------------------------------ Invalid input(s): POCBNP    Elo Marmolejos D.O. on 12/21/2015 at 12:37 PM  Between 7am to 7pm - Pager - [phone]  After 7pm go to [email or web address] - password [email or web address]  And look for the night coverage person covering for me after hours  [facility] Office  [phone]

## 2015-12-22 LAB — CBC
HEMATOCRIT: 27.9 % — AB (ref 36.0–46.0)
Hemoglobin: 8.7 g/dL — ABNORMAL LOW (ref 12.0–15.0)
MCH: 26.2 pg (ref 26.0–34.0)
MCHC: 31.2 g/dL (ref 30.0–36.0)
MCV: 84 fL (ref 78.0–100.0)
Platelets: 261 10*3/uL (ref 150–400)
RBC: 3.32 MIL/uL — AB (ref 3.87–5.11)
RDW: 15.6 % — AB (ref 11.5–15.5)
WBC: 4.3 10*3/uL (ref 4.0–10.5)

## 2015-12-22 LAB — BASIC METABOLIC PANEL
Anion gap: 8 (ref 5–15)
BUN: <5 mg/dL — ABNORMAL LOW (ref 6–20)
CALCIUM: 8.6 mg/dL — AB (ref 8.9–10.3)
CO2: 24 mmol/L (ref 22–32)
CREATININE: 0.71 mg/dL (ref 0.44–1.00)
Chloride: 110 mmol/L (ref 101–111)
GFR calc Af Amer: >60 mL/min (ref >60)
GFR calc non Af Amer: >60 mL/min (ref >60)
GLUCOSE: 124 mg/dL — AB (ref 65–99)
Potassium: 4.1 mmol/L (ref 3.5–5.1)
Sodium: 142 mmol/L (ref 135–145)

## 2015-12-22 MED ORDER — POLYETHYLENE GLYCOL 3350 17 G PO PACK
17.0000 g | PACK | Freq: Two times a day (BID) | ORAL | Status: DC
  Administered 2015-12-22 – 2015-12-25 (×5): 17 g via ORAL
  Filled 2015-12-22 (×10): qty 1

## 2015-12-22 MED ORDER — ENOXAPARIN SODIUM 40 MG/0.4ML ~~LOC~~ SOLN
40.0000 mg | SUBCUTANEOUS | Status: DC
  Filled 2015-12-22 (×7): qty 0.4

## 2015-12-22 MED ORDER — ALUM & MAG HYDROXIDE-SIMETH 200-200-20 MG/5ML PO SUSP
30.0000 mL | ORAL | Status: DC | PRN
  Administered 2015-12-22 – 2015-12-28 (×5): 30 mL via ORAL
  Filled 2015-12-22 (×6): qty 30

## 2015-12-22 NOTE — Progress Notes (Signed)
[facility]                                                                              Patient Demographics  Melinda Washington, is a 75 y.o. female, DOB - 05/31/1941, [id number]  Admit date - 12/18/2015   Admitting Physician Bobette Mo, MD  Outpatient Primary MD for the patient is Elvina Sidle, MD  LOS - 4   Chief Complaint  Patient presents with  . Abdominal Pain      HPI on 12/18/2015 by Dr. Sanda Klein Melinda Washington is a 75 y.o. female with a past medical history of diverticulosis, hypertension, anxiety, migraine headaches, osteoarthritis, and the emergency department with a history of abdominal pain since Monday, associated with constipation since Sunday. Patient stated that she had an an episode of self-induced vomiting in the morning, which helped, but only for a while. She is states that the pain is similar to her episodes of diverticulitis and was relieved by fentanyl and Zofran given by EMS.  Imaging done in the emergency department shows a mid small bowel obstruction without any evidence of perforation.  Interim history Patient has bowel sounds with passing of flatus. Started on clear liquids. Continue to monitor.  Assessment & Plan   Abdominal pain secondary to Small bowel obstruction -CT abdomen and pelvis showed mid small bowel obstruction without definite evidence of perforation. Questionable mild bowel wall thickening at the site of obstruction could be related to inflammation, stricture, mass not excluded -Continue conservative management, IV fluids, pain medications -Abd xray 12/19/15: Small bowel obstruction, no free air -Patient does have active bowel sounds. No nausea or vomiting. +flatus -Dr. Colin Ina with general surgery, Dr. Luisa Hart, who suggested clear liquids and monitor -Patient started on clear liquids, still reports poor appetite, but passing flatness, no nausea, no vomiting, will advance to full liquid 8 abdominal x-ray in  a.m.  History of diverticulosis -No signs of inflammation or active diverticulitis on CT of the abdomen pelvis  Migraine headaches -Continue Imitrex  Essential hypertension -Losartan currently held -Continue to monitor, will order medications as needed  Normocytic anemia -Iron supplementation currently held -Continue to monitor CBC  Code Status: Full  Family Communication: None at bedside  Disposition Plan: Admitted. Continue to monitor.   Time Spent in minutes   30 minutes  Procedures  None  Consults   General surgery, Dr. Luisa Hart (in passing)  DVT Prophylaxis lovenox   SCDs  Lab Results  Component Value Date   PLT 261 12/22/2015    Medications  Scheduled Meds: . alum & mag hydroxide-simeth  30 mL Oral Once  . polyethylene glycol  17 g Oral BID   Continuous Infusions: . 0.9 % NaCl with KCl 20 mEq / L 100 mL/hr at 12/22/15 0402   PRN Meds:.bisacodyl, morphine injection, ondansetron **OR** ondansetron (ZOFRAN) IV, SUMAtriptan  Antibiotics    Anti-infectives    None      Subjective:   [name] seen and examined today. Able to tolerate liquid diet.  Did have some gas passage.  Denied nausea or vomiting, but did feel some bloating.  Denies chest pain, shortness of breath.  Objective:   Filed Vitals:  12/21/15 0611 12/21/15 1411 12/21/15 2156 12/22/15 0600  BP: 152/97 159/99 146/93 146/96  Pulse: 87 91 93 86  Temp: 98.4 F (36.9 C) 98.5 F (36.9 C) 98.7 F (37.1 C) 98.4 F (36.9 C)  TempSrc: Oral Oral Oral Oral  Resp: Height:      Weight:      SpO2: 98% 99% 94% 99%    Wt Readings from Last 3 Encounters:  12/18/15 66.089 kg (145 lb 11.2 oz)  08/19/15 65.772 kg (145 lb)  08/12/15 65.772 kg (145 lb)     Intake/Output Summary (Last 24 hours) at 12/22/15 1420 Last data filed at 12/22/15 9147  Gross per 24 hour  Intake   1680 ml  Output      0 ml  Net   1680 ml    Exam  General: Well developed, well nourished,  NAD  HEENT: NCAT,mucous membranes moist.   Cardiovascular: S1 S2 auscultated, RRR, no murmurs  Respiratory: Clear to auscultation bilaterally   Abdomen: Soft, mild TTP mid abdomen, nondistended, + bowel sounds  Extremities: warm dry without cyanosis clubbing or edema  Neuro: AAOx3, nonfocal  Psych: Normal affect and demeanor, pleasant  Data Review   Micro Results Recent Results (from the past 240 hour(s))  Urine culture     Status: None   Collection Time: 12/18/15  6:00 PM  Result Value Ref Range Status   Specimen Description URINE, CLEAN CATCH  Final   Special Requests NONE  Final   Culture   Final    NO GROWTH 1 DAY Performed at [facility]    Report Status 12/20/2015 FINAL  Final    Radiology Reports Dg Abd 1 View  12/21/2015  CLINICAL DATA:  75 year old female scratches 75 year old female with history of small-bowel obstruction. Abdominal pain and distention with intermittent nausea and vomiting. EXAM: ABDOMEN - 1 VIEW COMPARISON:  12/19/2015. FINDINGS: Gas and stool are noted throughout the colon extending to the level of the distal rectum. In addition, there are multiple dilated loops of gas-filled small bowel measuring up to 5.4 cm in the central abdomen. No definite pneumoperitoneum on this single supine view the abdomen. Status post left hip arthroplasty. IMPRESSION: 1. Although there is evidence of persistent small bowel obstruction, at this point, this appears to be partial as there is now some gas and stool throughout the colon and rectum. Electronically Signed   By: Trudie Reed M.D.   On: 12/21/2015 15:09   Ct Abdomen Pelvis W Contrast  12/18/2015  CLINICAL DATA:  Generalized abdominal pain since Monday, intermittent diarrhea, vomiting, hypertension, history diverticulitis EXAM: CT ABDOMEN AND PELVIS WITH CONTRAST TECHNIQUE: Multidetector CT imaging of the abdomen and pelvis was performed using the standard protocol following bolus administration of  intravenous contrast. CONTRAST:  ISOVUE-300 IOPAMIDOL (ISOVUE-300) INJECTION 61% IV. Dilute oral contrast. COMPARISON:  None FINDINGS: Dependent atelectasis at lung bases. Gallstones within gallbladder with question additional sludge layer dependently. Liver, spleen, pancreas, kidneys, and adrenal glands normal. Distended stomach and proximal small bowel loops with decompressed distal small bowel loops consistent with small bowel obstruction. Transition from dilated to nondilated small bowel occurs in RIGHT mid abdomen image 57 with question mild bowel wall thickening at site. Small bowel loops measure up to 5.6 cm diameter. Distal small bowel and colon decompressed. Sigmoid diverticulosis without evidence of diverticulitis. Several tiny foci of gas are seen adjacent to a small bowel loop in the mid abdomen, all likely within lumen or potentially  a few tiny small bowel diverticula. No definite free intraperitoneal air or free fluid. Scattered atherosclerotic calcifications. Beam hardening artifacts from LEFT hip prosthesis obscure portions of pelvis. Bladder decompressed. Uterus surgically absent without definite visualization of the ovaries. Appendix not seen. No mass, hernia, or acute osseous findings. IMPRESSION: Mid small bowel obstruction without definite evidence of perforation. Questionable mild bowel wall thickening at the site of obstruction, could be related to inflammation, stricture, mass not excluded. Electronically Signed   By: Ulyses SouthwardMark  Boles M.D.   On: 12/18/2015 19:52   Dg Abd 2 Views  12/19/2015  CLINICAL DATA:  Bowel obstruction EXAM: ABDOMEN - 2 VIEW COMPARISON:  CT abdomen and pelvis December 18, 2015 FINDINGS: Supine and upright images obtained. There are multiple loops of dilated bowel with multiple air-fluid levels consistent with persistent obstruction. No free air. There is atelectatic change in the left lung base. 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 IMPRESSION: Evidence of small  bowel obstruction.  No free air. These results will be called to the ordering clinician or representative by the Radiologist Assistant, and communication documented in the PACS or zVision Dashboard. Electronically Signed   By: Bretta BangWilliam  Woodruff III M.D.   On: 12/19/2015 14:54    CBC  Recent Labs Lab 12/18/15 1808 12/19/15 0519 12/20/15 0531 12/22/15 0518  WBC 9.0 4.7 4.0 4.3  HGB 9.9* 8.4* 9.6* 8.7*  HCT 31.5* 27.1* 31.0* 27.9*  PLT 285 240 288 261  MCV 83.8 83.9 86.4 84.0  MCH 26.3 26.0 26.7 26.2  MCHC 31.4 31.0 31.0 31.2  RDW 15.9* 15.9* 15.8* 15.6*  LYMPHSABS 0.7 0.4* 0.7  --   MONOABS 0.5 1.0 0.8  --   EOSABS 0.0 0.1 0.1  --   BASOSABS 0.0 0.0 0.0  --     Chemistries   Recent Labs Lab 12/18/15 1808 12/18/15 1825 12/19/15 0519 12/20/15 0531 12/22/15 0518  NA 141  --  144 142 142  K 3.6  --  3.6 4.2 4.1  CL 101  --  108 110 110  CO2 26  --  29 21* 24  GLUCOSE 120*  --  122* 70 124*  BUN 33*  --  26* 15 <5*  CREATININE 0.98  --  0.84 0.75 0.71  CALCIUM 9.4  --  8.5* 8.7* 8.6*  MG  --  2.4  --   --   --   AST 12*  --  11* 17  --   ALT 11*  --  9* 14  --   ALKPHOS 88  --  73 73  --   BILITOT 1.4*  --  0.5 0.9  --    ------------------------------------------------------------------------------------------------------------------ estimated creatinine clearance is 58.8 mL/min (by C-G formula based on Cr of 0.71). ------------------------------------------------------------------------------------------------------------------ No results for input(s): HGBA1C in the last 72 hours. ------------------------------------------------------------------------------------------------------------------ No results for input(s): CHOL, HDL, LDLCALC, TRIG, CHOLHDL, LDLDIRECT in the last 72 hours. ------------------------------------------------------------------------------------------------------------------ No results for input(s): TSH, T4TOTAL, T3FREE, THYROIDAB in the last 72  hours.  Invalid input(s): FREET3 ------------------------------------------------------------------------------------------------------------------ No results for input(s): VITAMINB12, FOLATE, FERRITIN, TIBC, IRON, RETICCTPCT in the last 72 hours.  Coagulation profile No results for input(s): INR, PROTIME in the last 168 hours.  No results for input(s): DDIMER in the last 72 hours.  Cardiac Enzymes No results for input(s): CKMB, TROPONINI, MYOGLOBIN in the last 168 hours.  Invalid input(s): CK ------------------------------------------------------------------------------------------------------------------ Invalid input(s): POCBNP    Hara Milholland MD. on 12/22/2015 at 2:20 PM  Between 7am to  7pm - Pager - [phone]  After 7pm go to [email or web address] - password [email or web address]  And look for the night coverage person covering for me after hours  [facility] Office  [phone]

## 2015-12-23 ENCOUNTER — Inpatient Hospital Stay (HOSPITAL_COMMUNITY): Payer: Medicare Other

## 2015-12-23 MED ORDER — HYDRALAZINE HCL 20 MG/ML IJ SOLN: 5.0000 mg | INTRAMUSCULAR | Status: DC | PRN

## 2015-12-23 MED ORDER — SUMATRIPTAN SUCCINATE 6 MG/0.5ML ~~LOC~~ SOLN
6.0000 mg | SUBCUTANEOUS | Status: DC | PRN
  Administered 2015-12-23 – 2015-12-24 (×3): 6 mg via SUBCUTANEOUS
  Filled 2015-12-23 (×7): qty 0.5

## 2015-12-23 MED ORDER — LORAZEPAM 2 MG/ML IJ SOLN
0.5000 mg | Freq: Once | INTRAMUSCULAR | Status: AC
  Administered 2015-12-23: 0.5 mg via INTRAVENOUS
  Filled 2015-12-23: qty 1

## 2015-12-23 MED ORDER — PHENOL 1.4 % MT LIQD
1.0000 | OROMUCOSAL | Status: DC | PRN
  Filled 2015-12-23: qty 177

## 2015-12-23 NOTE — Consult Note (Signed)
Reason for Consult:  SBO Referring Physician:  Noelle Penner PCP:  Robyn Haber, MD   Melinda Washington is an 75 y.o. female.   HPI: Pt brought to ED on 12/18/15 with abdominal pain, intermittent diarrhea, (she uses magnesium citrate for her migraines) she made herself throw up. She had been sick since 12/15/15, but thought it was diverticulitis.  It got so bad she called EMS and brought her to the ED on 12/18/15.   Work up in the ED showed a SBO. CT scan on admit 12/18/15, showed:  Distended stomach and proximal small bowel loops with decompressed distal small bowel loops consistent with small bowel obstruction. Transition from dilated to nondilated small bowel occurs in RIGHT mid abdomen image 57 with question mild bowel wall thickening at site.  Small bowel loops measure up to 5.6 cm diameter.    She was admitted by Medicine at that time.  She has been managed with bowel rest, and hydration.  She was started back on clear liquids on 12/20/15 after a BM.  She had a BM on 12/22/15.(pt says they were very small) She had repeat films today and this shows persistent SBO some gas in the colon.  We are ask to see. She has a prior hx of hysterectomy, perforated diverticulitis with large hernia repair with Mesh back in the Mid 1990's.  She is afebrile, but hypertensive.  Labs OK yesterday some anemia.  K+ 4.1.    Past Medical History  Diagnosis Date  . Diverticulitis   . Hypertension   . Anxiety   . Headache     migraines  . Arthritis     Past Surgical History  Procedure Laterality Date  . Hernia repair    . Abdominal hysterectomy      Partial  . Carpal tunnel release Right   . Bunionectomy Left   . Abdominal surgery due to barium enema prep - enlarged intestines which resulted in hernias.  has mesh in abdomen from incisional hernias    . Tonsillectomy    . 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 No pertinent family history.  Social History:  reports that she has never smoked. She has never used smokeless tobacco. She reports that she drinks alcohol. She reports that she does not use illicit drugs.   Tobacco: none ETOH:  None Drugs:  None Divorced and lives alone with cats.   Allergies:  Allergies  Allergen Reactions  . Sulfamethoxazole Rash  . Ciprofloxacin     Pt says it makes her feeling "very weak."   . Codeine Nausea And Vomiting    All pain medications cause some degree of nausea (moderate to severe)- able to tolerate when premedicated with antiemetic  . Ketorolac Tromethamine Anxiety    Tremors, teeth chattering  . Latex Rash    Redness & burning.  Marland Kitchen Penicillins Rash    Has patient had a PCN reaction causing immediate rash, facial/tongue/throat swelling, SOB or lightheadedness with hypotension: No Has patient had a PCN reaction causing severe rash involving mucus membranes or skin necrosis: No Has patient had a PCN reaction that required hospitalization No Has patient had a PCN reaction occurring within the last 10 years: No If all of the above answers are "NO", then may proceed with Cephalosporin use.     Medications:  Prior to Admission:  Prescriptions  prior to admission  Medication Sig Dispense Refill Last Dose  . Ascorbic Acid (VITAMIN C) 1000 MG tablet Take 4,000 mg by mouth 2 (two) times daily.    12/17/2015 at Unknown time  . B Complex Vitamins (VITAMIN-B COMPLEX) TABS Take 1 tablet by mouth daily.    2 weeks at Unknown time  . Cholecalciferol (VITAMIN D3) 1000 UNITS CAPS Take 4,000 Units by mouth daily.   Past Week at Unknown time  . DIGESTIVE ENZYMES PO Take 1 capsule by mouth 2 (two) times daily with a meal.    Past Week at Unknown time  . MAGNESIUM CITRATE PO Take 1,200 mg by mouth daily.   12/17/2015 at Unknown time  . Menaquinone-7 (VITAMIN K2 PO) Take 80 mcg by mouth daily.    Past Week at Unknown time  . Probiotic Product (PROBIOTIC DAILY PO) Take 1 capsule by mouth daily.    12/14/2015  . SUMAtriptan (IMITREX) 100 MG tablet Take 1 tablet (100 mg total) by mouth every 2 (two) hours as needed for migraine. May take one tablet on the onset of headache. Then repeat in 2 hours as needed. Maximum 2 doses in 24 hours   unknown  . Turmeric POWD Take 1,000 mg by mouth daily.    Past Week at Unknown time  . acetaminophen (TYLENOL) 500 MG tablet Take 2 tablets (1,000 mg total) by mouth every 8 (eight) hours. (Patient not taking: Reported on 12/18/2015) 30 tablet 0 Not Taking at Unknown time  . ALPRAZolam (XANAX) 0.25 MG tablet Take 1 tablet (0.25 mg total) by mouth 3 (three) times daily as needed for anxiety. (Patient not taking: Reported on 12/18/2015) 21 tablet 0 Completed Course at Unknown time  . docusate sodium (COLACE) 100 MG capsule Take 1 capsule (100 mg total) by mouth 2 (two) times daily. (Patient not taking: Reported on 12/18/2015) 10 capsule 0 Not Taking at Unknown time  . ferrous sulfate 325 (65 FE) MG tablet Take 1 tablet (325 mg total) by mouth 3 (three) times daily after meals. (Patient not taking: Reported on 12/18/2015)  3 Not Taking at Unknown time  . losartan (COZAAR) 100 MG tablet Take 1 tablet (100 mg total) by mouth daily. (Patient not taking: Reported on 12/18/2015) 90 tablet 3 Not Taking at Unknown time  . polyethylene glycol (MIRALAX / GLYCOLAX) packet Take 17 g by mouth 2 (two) times daily. (Patient not taking: Reported on 12/18/2015) 14 each 0 Not Taking at Unknown time  . traMADol (ULTRAM) 50 MG tablet Take 1-2 tablets (50-100 mg total) by mouth every 6 (six) hours. (Patient not taking: Reported on 12/18/2015) 80 tablet 0 Completed Course at Unknown time   Scheduled: . alum & mag hydroxide-simeth  30 mL Oral Once  . enoxaparin (LOVENOX) injection  40 mg Subcutaneous Q24H  . polyethylene glycol  17 g Oral BID   Continuous: . 0.9 % NaCl with KCl 20 mEq / L 100  mL/hr at 12/23/15 0221   YLT:EIHD & mag hydroxide-simeth, bisacodyl, morphine injection, ondansetron **OR** ondansetron (ZOFRAN) IV, SUMAtriptan Anti-infectives    None      Results for orders placed or performed during the hospital encounter of 12/18/15 (from the past 48 hour(s))  CBC     Status: Abnormal   Collection Time: 12/22/15  5:18 AM  Result Value Ref Range   WBC 4.3 4.0 - 10.5 K/uL   RBC 3.32 (L) 3.87 - 5.11 MIL/uL   Hemoglobin 8.7 (L) 12.0 - 15.0 g/dL   HCT  27.9 (L) 36.0 - 46.0 %   MCV 84.0 78.0 - 100.0 fL   MCH 26.2 26.0 - 34.0 pg   MCHC 31.2 30.0 - 36.0 g/dL   RDW 15.6 (H) 11.5 - 15.5 %   Platelets 261 150 - 400 K/uL  Basic metabolic panel     Status: Abnormal   Collection Time: 12/22/15  5:18 AM  Result Value Ref Range   Sodium 142 135 - 145 mmol/L   Potassium 4.1 3.5 - 5.1 mmol/L   Chloride 110 101 - 111 mmol/L   CO2 24 22 - 32 mmol/L   Glucose, Bld 124 (H) 65 - 99 mg/dL   BUN <5 (L) 6 - 20 mg/dL   Creatinine, Ser 0.71 0.44 - 1.00 mg/dL   Calcium 8.6 (L) 8.9 - 10.3 mg/dL   GFR calc non Af Amer >60 >60 mL/min   GFR calc Af Amer >60 >60 mL/min    Comment: (NOTE) The eGFR has been calculated using the CKD EPI equation. This calculation has not been validated in all clinical situations. eGFR's persistently <60 mL/min signify possible Chronic Kidney Disease.    Anion gap 8 5 - 15    Dg Abd 1 View  12/21/2015  CLINICAL DATA:  74 year old female scratches 75 year old female with history of small-bowel obstruction. Abdominal pain and distention with intermittent nausea and vomiting. EXAM: ABDOMEN - 1 VIEW COMPARISON:  12/19/2015. FINDINGS: Gas and stool are noted throughout the colon extending to the level of the distal rectum. In addition, there are multiple dilated loops of gas-filled small bowel measuring up to 5.4 cm in the central abdomen. No definite pneumoperitoneum on this single supine view the abdomen. Status post left hip arthroplasty. IMPRESSION: 1.  Although there is evidence of persistent small bowel obstruction, at this point, this appears to be partial as there is now some gas and stool throughout the colon and rectum. Electronically Signed   By: Vinnie Langton M.D.   On: 12/21/2015 15:09   Dg Abd 2 Views  12/23/2015  CLINICAL DATA:  Abdominal pain and known small bowel dilatation EXAM: ABDOMEN - 2 VIEW COMPARISON:  12/21/2015 FINDINGS: Scattered large and small bowel gas is noted. Multiple dilated loops of small bowel are again identified. Decubitus view shows no evidence of free air. No acute bony abnormality is noted. Left hip prosthesis is seen. IMPRESSION: Persistent small bowel dilatation consistent with a partial small bowel obstruction. Some colonic gas is seen similar to that noted on the prior exam. Electronically Signed   By: Inez Catalina M.D.   On: 12/23/2015 09:38    Review of Systems  Constitutional: Positive for fever (low grade at home). Negative for chills, weight loss, malaise/fatigue and diaphoresis.  Eyes: Negative.   Respiratory: Negative.   Cardiovascular: Negative.   Gastrointestinal: Positive for heartburn, abdominal pain and constipation. Negative for nausea, vomiting, diarrhea, blood in stool and melena.       She takes mag citrate for her migraines and has constipation issues.  Genitourinary: Negative.   Musculoskeletal: Negative.   Skin: Negative.   Neurological: Positive for headaches (2-6 per month). Negative for weakness.  Endo/Heme/Allergies: Negative.   Psychiatric/Behavioral: Negative.    Blood pressure 138/95, pulse 88, temperature 98.9 F (37.2 C), temperature source Oral, resp. rate 18, height 5' 3.5" (1.613 m), weight 66.089 kg (145 lb 11.2 oz), last menstrual period 04/04/2012, SpO2 96 %. Physical Exam  Constitutional: She is oriented to person, place, and time. She appears well-developed and well-nourished. No  distress.  HENT:  Head: Atraumatic.  Nose: Nose normal.  Eyes: Conjunctivae and  EOM are normal. Right eye exhibits no discharge. Left eye exhibits no discharge. No scleral icterus.  Neck: Normal range of motion. Neck supple. No JVD present. No tracheal deviation present. No thyromegaly present.  Cardiovascular: Normal rate, regular rhythm, normal heart sounds and intact distal pulses.   No murmur heard. Respiratory: Effort normal and breath sounds normal. No respiratory distress. She has no wheezes. She has no rales. She exhibits no tenderness.  GI: She exhibits distension. She exhibits no mass. There is tenderness. There is no rebound and no guarding.  Few hyperactive BS, nothing lower abdomen  Midline large abdominal incision    Musculoskeletal: She exhibits no edema or tenderness.  Lymphadenopathy:    She has no cervical adenopathy.  Neurological: She is alert and oriented to person, place, and time. No cranial nerve deficit.  Skin: Skin is warm and dry. No rash noted. She is not diaphoretic. No erythema. No pallor.  Psychiatric: She has a normal mood and affect. Her behavior is normal. Judgment and thought content normal.    Assessment/Plan:  SBO with hx of perforated diverticulitis and prior hernia repair with mesh. Hx of diverticulitis Hx of Migraines 2-6 per month now Hypertension   Plan:  I have recommended she get an NG placed.  Decompress her stomach.  We could consider  SB protocol if no progress in the AM.  We will follow with you.  I will order follow up films after NG placed and in AM.     Shona Pardo 12/23/2015, 11:42 AM

## 2015-12-23 NOTE — Progress Notes (Signed)
After patient agreed to let nursing staff attempt nasogastric insertion, ng tube inserted into left nare, after 2 attempts.  Patient did not tolerate procedure very well and was very uncomfortable with tube.  NG checked by auscultation, and had 1000 cc's tan drainage immediately after insertion.

## 2015-12-23 NOTE — Consult Note (Signed)
   [facility]   12/23/2015  Melinda Washington [birth year] [id number]   [facility]. Went to bedside to speak with her about program. However, she was in the bathroom. Will try back at later time to discuss and offer [facility].    Raiford NobleAtika Kayah Hecker, MSN-Ed, RN,BSN [facility] Care Management Hospital Liaison [phone]

## 2015-12-23 NOTE — Progress Notes (Addendum)
[facility]                                                                              Patient Demographics  Melinda Washington, is a 75 y.o. female, DOB - 1941/01/10, ZOX:[id number]  Admit date - 12/18/2015   Admitting Physician Bobette Moavid Manuel Ortiz, MD  Outpatient Primary MD for the patient is Elvina SidleLAUENSTEIN,KURT, MD  LOS - 5   Chief Complaint  Patient presents with  . Abdominal Pain      HPI on 12/18/2015 by Dr. Sanda Kleinavid Ortiz Melinda Washington is a 75 y.o. female with a past medical history of diverticulosis, hypertension, anxiety, migraine headaches, osteoarthritis, and the emergency department with a history of abdominal pain since Monday, associated with constipation since Sunday. Patient stated that she had an an episode of self-induced vomiting in the morning, which helped, but only for a while. She is states that the pain is similar to her episodes of diverticulitis and was relieved by fentanyl and Zofran given by EMS.  Imaging done in the emergency department shows a mid small bowel obstruction without any evidence of perforation.  Interim history Patient with very slow improvement of her SBO, only passing flatness, no bowel movement, since surgery consulted 4/18, NG tube inserted.  Assessment & Plan   Abdominal pain secondary to Small bowel obstruction -CT abdomen and pelvis showed mid small bowel obstruction without definite evidence of perforation. Questionable mild bowel wall thickening at the site of obstruction could be related to inflammation, stricture, mass not excluded -Continue conservative management, IV fluids, pain medications -Patient does have active bowel sounds. No nausea or vomiting. +flatus -Clear liquid diet attempted, poor oral intake, no bowel movement  - Surgery consult 4/18 appreciated, plan is for NG tube to decompress the stomach, repeat KUB in a.m.,    History of diverticulosis -No signs of inflammation or active diverticulitis on CT of the  abdomen pelvis  Migraine headaches -Continue Imitrex  Essential hypertension -Losartan currently held -Continue to monitor, started on when necessary hydralazine  Normocytic anemia -Iron supplementation currently held -Continue to monitor CBC  Code Status: Full  Family Communication: None at bedside  Disposition Plan: Admitted. Continue to monitor.   Time Spent in minutes   25 minutes  Procedures  None  Consults   General surgery  DVT Prophylaxis lovenox   SCDs  Lab Results  Component Value Date   PLT 261 12/22/2015    Medications  Scheduled Meds: . alum & mag hydroxide-simeth  30 mL Oral Once  . enoxaparin (LOVENOX) injection  40 mg Subcutaneous Q24H  . polyethylene glycol  17 g Oral BID   Continuous Infusions: . 0.9 % NaCl with KCl 20 mEq / L 100 mL/hr at 12/23/15 1303   PRN Meds:.alum & mag hydroxide-simeth, bisacodyl, morphine injection, ondansetron **OR** ondansetron (ZOFRAN) IV, SUMAtriptan  Antibiotics    Anti-infectives    None      Subjective:   Melinda Friendlyosemarie Double personal bowel movement, mild abdominal pain, no nausea or vomiting, no appetite  Objective:   Filed Vitals:   12/22/15 1453 12/22/15 2101 12/23/15 0517 12/23/15 1408  BP: 168/98 155/105 138/95   Pulse: 87 87 88 82  Temp:  98.6 F (37 C) 98.3 F (36.8 C) 98.9 F (37.2 C) 98.7 F (37.1 C)  TempSrc: Oral Oral Oral Oral  Resp: Height:      Weight:      SpO2: 99% 99% 96%     Wt Readings from Last 3 Encounters:  12/18/15 66.089 kg (145 lb 11.2 oz)  08/19/15 65.772 kg (145 lb)  08/12/15 65.772 kg (145 lb)     Intake/Output Summary (Last 24 hours) at 12/23/15 1605 Last data filed at 12/22/15 1810  Gross per 24 hour  Intake 1116.67 ml  Output      0 ml  Net 1116.67 ml    Exam  General: Well developed, well nourished, NAD  HEENT: NCAT,mucous membranes moist.   Cardiovascular: S1 S2 auscultated, RRR, no murmurs  Respiratory: Clear to auscultation  bilaterally   Abdomen: Soft, mild TTP mid abdomen,mildly distended , hyperactive bowel sounds   Extremities: warm dry without cyanosis clubbing or edema  Neuro: AAOx3, nonfocal  Psych: Normal affect and demeanor, pleasant  Data Review   Micro Results Recent Results (from the past 240 hour(s))  Urine culture     Status: None   Collection Time: 12/18/15  6:00 PM  Result Value Ref Range Status   Specimen Description URINE, CLEAN CATCH  Final   Special Requests NONE  Final   Culture   Final    NO GROWTH 1 DAY Performed at [facility]    Report Status 12/20/2015 FINAL  Final    Radiology Reports Dg Abd 1 View  12/21/2015  CLINICAL DATA:  75 year old female scratches 75 year old female with history of small-bowel obstruction. Abdominal pain and distention with intermittent nausea and vomiting. EXAM: ABDOMEN - 1 VIEW COMPARISON:  12/19/2015. FINDINGS: Gas and stool are noted throughout the colon extending to the level of the distal rectum. In addition, there are multiple dilated loops of gas-filled small bowel measuring up to 5.4 cm in the central abdomen. No definite pneumoperitoneum on this single supine view the abdomen. Status post left hip arthroplasty. IMPRESSION: 1. Although there is evidence of persistent small bowel obstruction, at this point, this appears to be partial as there is now some gas and stool throughout the colon and rectum. Electronically Signed   By: Trudie Reed M.D.   On: 12/21/2015 15:09   Ct Abdomen Pelvis W Contrast  12/18/2015  CLINICAL DATA:  Generalized abdominal pain since Monday, intermittent diarrhea, vomiting, hypertension, history diverticulitis EXAM: CT ABDOMEN AND PELVIS WITH CONTRAST TECHNIQUE: Multidetector CT imaging of the abdomen and pelvis was performed using the standard protocol following bolus administration of intravenous contrast. CONTRAST:  ISOVUE-300 IOPAMIDOL (ISOVUE-300) INJECTION 61% IV. Dilute oral contrast. COMPARISON:   None FINDINGS: Dependent atelectasis at lung bases. Gallstones within gallbladder with question additional sludge layer dependently. Liver, spleen, pancreas, kidneys, and adrenal glands normal. Distended stomach and proximal small bowel loops with decompressed distal small bowel loops consistent with small bowel obstruction. Transition from dilated to nondilated small bowel occurs in RIGHT mid abdomen image 57 with question mild bowel wall thickening at site. Small bowel loops measure up to 5.6 cm diameter. Distal small bowel and colon decompressed. Sigmoid diverticulosis without evidence of diverticulitis. Several tiny foci of gas are seen adjacent to a small bowel loop in the mid abdomen, all likely within lumen or potentially a few tiny small bowel diverticula. No definite free intraperitoneal air or free fluid. Scattered atherosclerotic calcifications. Beam hardening artifacts from LEFT hip prosthesis  obscure portions of pelvis. Bladder decompressed. Uterus surgically absent without definite visualization of the ovaries. Appendix not seen. No mass, hernia, or acute osseous findings. IMPRESSION: Mid small bowel obstruction without definite evidence of perforation. Questionable mild bowel wall thickening at the site of obstruction, could be related to inflammation, stricture, mass not excluded. Electronically Signed   By: Ulyses Southward M.D.   On: 12/18/2015 19:52   Dg Abd 2 Views  12/23/2015  CLINICAL DATA:  Abdominal pain and known small bowel dilatation EXAM: ABDOMEN - 2 VIEW COMPARISON:  12/21/2015 FINDINGS: Scattered large and small bowel gas is noted. Multiple dilated loops of small bowel are again identified. Decubitus view shows no evidence of free air. No acute bony abnormality is noted. Left hip prosthesis is seen. IMPRESSION: Persistent small bowel dilatation consistent with a partial small bowel obstruction. Some colonic gas is seen similar to that noted on the prior exam. Electronically Signed   By:  Alcide Clever M.D.   On: 12/23/2015 09:38   Dg Abd 2 Views  12/19/2015  CLINICAL DATA:  Bowel obstruction EXAM: ABDOMEN - 2 VIEW COMPARISON:  CT abdomen and pelvis December 18, 2015 FINDINGS: Supine and upright images obtained. There are multiple loops of dilated bowel with multiple air-fluid levels consistent with persistent obstruction. No free air. There is atelectatic change in the left lung base. 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 IMPRESSION: Evidence of small bowel obstruction.  No free air. These results will be called to the ordering clinician or representative by the Radiologist Assistant, and communication documented in the PACS or zVision Dashboard. Electronically Signed   By: Bretta Bang III M.D.   On: 12/19/2015 14:54    CBC  Recent Labs Lab 12/18/15 1808 12/19/15 0519 12/20/15 0531 12/22/15 0518  WBC 9.0 4.7 4.0 4.3  HGB 9.9* 8.4* 9.6* 8.7*  HCT 31.5* 27.1* 31.0* 27.9*  PLT 285 240 288 261  MCV 83.8 83.9 86.4 84.0  MCH 26.3 26.0 26.7 26.2  MCHC 31.4 31.0 31.0 31.2  RDW 15.9* 15.9* 15.8* 15.6*  LYMPHSABS 0.7 0.4* 0.7  --   MONOABS 0.5 1.0 0.8  --   EOSABS 0.0 0.1 0.1  --   BASOSABS 0.0 0.0 0.0  --     Chemistries   Recent Labs Lab 12/18/15 1808 12/18/15 1825 12/19/15 0519 12/20/15 0531 12/22/15 0518  NA 141  --  144 142 142  K 3.6  --  3.6 4.2 4.1  CL 101  --  108 110 110  CO2 26  --  29 21* 24  GLUCOSE 120*  --  122* 70 124*  BUN 33*  --  26* 15 <5*  CREATININE 0.98  --  0.84 0.75 0.71  CALCIUM 9.4  --  8.5* 8.7* 8.6*  MG  --  2.4  --   --   --   AST 12*  --  11* 17  --   ALT 11*  --  9* 14  --   ALKPHOS 88  --  73 73  --   BILITOT 1.4*  --  0.5 0.9  --    ------------------------------------------------------------------------------------------------------------------ estimated creatinine clearance is 58.8 mL/min (by C-G formula based on Cr of  0.71). ------------------------------------------------------------------------------------------------------------------ No results for input(s): HGBA1C in the last 72 hours. ------------------------------------------------------------------------------------------------------------------ No results for input(s): CHOL, HDL, LDLCALC, TRIG, CHOLHDL, LDLDIRECT in the last 72 hours. ------------------------------------------------------------------------------------------------------------------ No results for input(s): TSH, T4TOTAL, T3FREE, THYROIDAB in the last 72 hours.  Invalid input(s): FREET3 ------------------------------------------------------------------------------------------------------------------  No results for input(s): VITAMINB12, FOLATE, FERRITIN, TIBC, IRON, RETICCTPCT in the last 72 hours.  Coagulation profile No results for input(s): INR, PROTIME in the last 168 hours.  No results for input(s): DDIMER in the last 72 hours.  Cardiac Enzymes No results for input(s): CKMB, TROPONINI, MYOGLOBIN in the last 168 hours.  Invalid input(s): CK ------------------------------------------------------------------------------------------------------------------ Invalid input(s): POCBNP    Kechia Yahnke MD. on 12/23/2015 at 4:05 PM  Between 7am to 7pm - Pager - [phone]  After 7pm go to [email or web address] - password [email or web address]  And look for the night coverage person covering for me after hours  [facility] Office  [phone]

## 2015-12-23 NOTE — Care Management Important Message (Signed)
Important Message  Patient Details  Name: Melinda Washington MRN: [id number] Date of Birth: 10/08/1940   Medicare Important Message Given:  Yes    Haskell FlirtJamison, Ovie Eastep 12/23/2015, 10:26 AMImportant Message  Patient Details  Name: Melinda Washington MRN: [id number] Date of Birth: 10/08/1940   Medicare Important Message Given:  Yes    Haskell FlirtJamison, Lien Lyman 12/23/2015, 10:26 AM

## 2015-12-24 ENCOUNTER — Inpatient Hospital Stay (HOSPITAL_COMMUNITY): Payer: Medicare Other

## 2015-12-24 DIAGNOSIS — K5669 Other intestinal obstruction: Secondary | ICD-10-CM

## 2015-12-24 DIAGNOSIS — G43909 Migraine, unspecified, not intractable, without status migrainosus: Secondary | ICD-10-CM

## 2015-12-24 DIAGNOSIS — I10 Essential (primary) hypertension: Secondary | ICD-10-CM

## 2015-12-24 LAB — BASIC METABOLIC PANEL
Anion gap: 8 (ref 5–15)
CHLORIDE: 110 mmol/L (ref 101–111)
CO2: 25 mmol/L (ref 22–32)
Calcium: 8.8 mg/dL — ABNORMAL LOW (ref 8.9–10.3)
Creatinine, Ser: 0.71 mg/dL (ref 0.44–1.00)
GFR calc Af Amer: >60 mL/min (ref >60)
GFR calc non Af Amer: >60 mL/min (ref >60)
GLUCOSE: 93 mg/dL (ref 65–99)
POTASSIUM: 4.1 mmol/L (ref 3.5–5.1)
Sodium: 143 mmol/L (ref 135–145)

## 2015-12-24 LAB — CBC
HEMATOCRIT: 28 % — AB (ref 36.0–46.0)
HEMOGLOBIN: 8.8 g/dL — AB (ref 12.0–15.0)
MCH: 26.8 pg (ref 26.0–34.0)
MCHC: 31.4 g/dL (ref 30.0–36.0)
MCV: 85.4 fL (ref 78.0–100.0)
Platelets: 300 10*3/uL (ref 150–400)
RBC: 3.28 MIL/uL — AB (ref 3.87–5.11)
RDW: 16.3 % — ABNORMAL HIGH (ref 11.5–15.5)
WBC: 5.7 10*3/uL (ref 4.0–10.5)

## 2015-12-24 MED ORDER — DIATRIZOATE MEGLUMINE & SODIUM 66-10 % PO SOLN
90.0000 mL | Freq: Once | ORAL | Status: AC
  Administered 2015-12-24: 90 mL via NASOGASTRIC
  Filled 2015-12-24: qty 90

## 2015-12-24 NOTE — Progress Notes (Signed)
Date:  December 24, 2015 Chart reviewed for concurrent status and case management needs. Will continue to follow patient for changes and needs: iv flds, npo and ngtube Marcelle Smilinghonda Ranieri, BSN, RN, [place]   [phone]

## 2015-12-24 NOTE — Progress Notes (Signed)
PROGRESS NOTE    Melinda Washington  ZOX:[id number] DOB: 1941-04-16 DOA: 12/18/2015  PCP: Elvina SidleLAUENSTEIN,KURT, MD    Brief Narrative:  Melinda SawyersRosemarie W Washington is a 75 y.o. female with a past medical history of diverticulosis, hypertension, anxiety, migraine headaches, osteoarthritis, and the emergency department with abdominal pain. Imaging in ER revealed SBO.   Assessment & Plan:   Principal Problem:   SBO (small bowel obstruction)  - NG tube placed and clamped today- small bowel protocol started today by Gen surgery -cont IVF  Active Problems:   Migraine - PRN Imitrex  HTN  - Losartan on hold- following BP- on IV Hydralazine PRN   DVT prophylaxis: Lovenox Code Status: full code Family Communication:  Disposition Plan: home when tolerating diet   Consultants:   gen surgery  Procedures:   none  Antimicrobials:  Anti-infectives    None      Subjective: No nausea. No pain in abdomen. No BM this AM   Objective: Filed Vitals:   12/23/15 0517 12/23/15 1408 12/24/15 0535 12/24/15 1502  BP: 138/95 163/100 160/94 138/92  Pulse: 88 82 84 85  Temp: 98.9 F (37.2 C) 98.7 F (37.1 C) 98.6 F (37 C) 98.3 F (36.8 C)  TempSrc: Oral Oral Oral Oral  Resp: 18 18 20 20   Height:      Weight:      SpO2: 96% 96% 96% 99%    Intake/Output Summary (Last 24 hours) at 12/24/15 1734 Last data filed at 12/24/15 1400  Gross per 24 hour  Intake 4503.33 ml  Output    200 ml  Net 4303.33 ml   Filed Weights   12/18/15 2305  Weight: 66.089 kg (145 lb 11.2 oz)    Examination: General exam: Appears calm and comfortable  Respiratory system: Clear to auscultation. Respiratory effort normal. Cardiovascular system: S1 & S2 heard, RRR. No JVD, murmurs, rubs, gallops or clicks. No pedal edema. Gastrointestinal system: Abdomen is nondistended, soft and nontender. No organomegaly or masses felt. Poor bowel sounds Central nervous system: Alert and oriented. No focal neurological  deficits. Extremities: Symmetric 5 x 5 power. Skin: No rashes, lesions or ulcers Psychiatry: Judgement and insight appear normal. Mood & affect appropriate.     Data Reviewed: I have personally reviewed following labs and imaging studies  CBC:  Recent Labs Lab 12/18/15 1808 12/19/15 0519 12/20/15 0531 12/22/15 0518 12/24/15 0530  WBC 9.0 4.7 4.0 4.3 5.7  NEUTROABS 7.8* 3.2 2.3  --   --   HGB 9.9* 8.4* 9.6* 8.7* 8.8*  HCT 31.5* 27.1* 31.0* 27.9* 28.0*  MCV 83.8 83.9 86.4 84.0 85.4  PLT 285 240 288 261 300   Basic Metabolic Panel:  Recent Labs Lab 12/18/15 1808 12/18/15 1825 12/19/15 0519 12/20/15 0531 12/22/15 0518 12/24/15 0530  NA 141  --  144 142 142 143  K 3.6  --  3.6 4.2 4.1 4.1  CL 101  --  108 110 110 110  CO2 26  --  29 21* 24 25  GLUCOSE 120*  --  122* 70 124* 93  BUN 33*  --  26* 15 <5* <5*  CREATININE 0.98  --  0.84 0.75 0.71 0.71  CALCIUM 9.4  --  8.5* 8.7* 8.6* 8.8*  MG  --  2.4  --   --   --   --   PHOS  --  2.1*  --   --   --   --    GFR: Estimated Creatinine Clearance: 58.8 mL/min (  by C-G formula based on Cr of 0.71). Liver Function Tests:  Recent Labs Lab 12/18/15 1808 12/19/15 0519 12/20/15 0531  AST 12* 11* 17  ALT 11* 9* 14  ALKPHOS 88 73 73  BILITOT 1.4* 0.5 0.9  PROT 6.9 5.7* 6.1*  ALBUMIN 3.5 2.8* 3.0*    Recent Labs Lab 12/18/15 1808  LIPASE 24   No results for input(s): AMMONIA in the last 168 hours. Coagulation Profile: No results for input(s): INR, PROTIME in the last 168 hours. Cardiac Enzymes: No results for input(s): CKTOTAL, CKMB, CKMBINDEX, TROPONINI in the last 168 hours. BNP (last 3 results) No results for input(s): PROBNP in the last 8760 hours. HbA1C: No results for input(s): HGBA1C in the last 72 hours. CBG: No results for input(s): GLUCAP in the last 168 hours. Lipid Profile: No results for input(s): CHOL, HDL, LDLCALC, TRIG, CHOLHDL, LDLDIRECT in the last 72 hours. Thyroid Function Tests: No  results for input(s): TSH, T4TOTAL, FREET4, T3FREE, THYROIDAB in the last 72 hours. Anemia Panel: No results for input(s): VITAMINB12, FOLATE, FERRITIN, TIBC, IRON, RETICCTPCT in the last 72 hours. Urine analysis:    Component Value Date/Time   COLORURINE YELLOW 12/18/2015 1800   APPEARANCEUR CLOUDY* 12/18/2015 1800   LABSPEC 1.027 12/18/2015 1800   PHURINE 8.0 12/18/2015 1800   GLUCOSEU NEGATIVE 12/18/2015 1800   HGBUR NEGATIVE 12/18/2015 1800   BILIRUBINUR NEGATIVE 12/18/2015 1800   BILIRUBINUR negative 05/05/2015 2154   KETONESUR >80* 12/18/2015 1800   PROTEINUR 100* 12/18/2015 1800   PROTEINUR negative 05/05/2015 2154   UROBILINOGEN 0.2 05/05/2015 2154   NITRITE NEGATIVE 12/18/2015 1800   NITRITE negative 05/05/2015 2154   LEUKOCYTESUR TRACE* 12/18/2015 1800   Sepsis Labs: (procalcitonin:4,lacticidven:4)  ) Recent Results (from the past 240 hour(s))  Urine culture     Status: None   Collection Time: 12/18/15  6:00 PM  Result Value Ref Range Status   Specimen Description URINE, CLEAN CATCH  Final   Special Requests NONE  Final   Culture   Final    NO GROWTH 1 DAY Performed at [facility]    Report Status 12/20/2015 FINAL  Final         Radiology Studies: Dg Abd 1 View  12/23/2015  CLINICAL DATA:  NG tube placement EXAM: ABDOMEN - 1 VIEW COMPARISON:  12/23/2015 FINDINGS: Enteric tube tip and side port project over the expected location of the gastric antrum. There is persistent marked gas distention of multiple loops of small bowel with index loop of small bowel within in the left mid abdomen measuring approximately 7.2 cm in diameter. Nondiagnostic evaluation for pneumoperitoneum secondary to supine positioning and exclusion a lower thorax. No definite pneumatosis. Nondiagnostic evaluation for portal venous gas given exclusion of the right upper abdominal quadrant. Multiple phleboliths overlie the lower pelvis bilaterally. Otherwise, no definitive  abnormal intra-abdominal calcifications. Post left total hip replacement. Degenerative change of the lower lumbar spine and right hip is suspected though incompletely evaluated. IMPRESSION: 1. Enteric tube tip and side port project over the expected location of the gastric antrum. 2. Findings worrisome for high-grade small-bowel obstruction. Electronically Signed   By: Simonne Come M.D.   On: 12/23/2015 18:58   Dg Abd 2 Views  12/23/2015  CLINICAL DATA:  Abdominal pain and known small bowel dilatation EXAM: ABDOMEN - 2 VIEW COMPARISON:  12/21/2015 FINDINGS: Scattered large and small bowel gas is noted. Multiple dilated loops of small bowel are again identified. Decubitus view shows no evidence of free air.  No acute bony abnormality is noted. Left hip prosthesis is seen. IMPRESSION: Persistent small bowel dilatation consistent with a partial small bowel obstruction. Some colonic gas is seen similar to that noted on the prior exam. Electronically Signed   By: Alcide Clever M.D.   On: 12/23/2015 09:38        Scheduled Meds: . alum & mag hydroxide-simeth  30 mL Oral Once  . enoxaparin (LOVENOX) injection  40 mg Subcutaneous Q24H  . polyethylene glycol  17 g Oral BID   Continuous Infusions: . 0.9 % NaCl with KCl 20 mEq / L 100 mL/hr at 12/24/15 1022     LOS: 6 days    Time spent in minutes: 35    Alee Gressman, MD [facility] Pager: [email or web address] Password [email or web address] 12/24/2015, 5:34 PM

## 2015-12-24 NOTE — Progress Notes (Signed)
Subjective: Pt says her stomach feels better, not much coming from NG and it looks a little coffee ground.  She says she had some stool, but again not what she considers a real BM.    Objective: Vital signs in last 24 hours: Temp:  [98.6 F (37 C)-98.7 F (37.1 C)] 98.6 F (37 C) (04/19 0535) Pulse Rate:  [82-84] 84 (04/19 0535) Resp:  [18-20] 20 (04/19 0535) BP: (160-163)/(94-100) 160/94 mmHg (04/19 0535) SpO2:  [96 %] 96 % (04/19 0535) Last BM Date: 12/22/15 1200 from NG Urine not recorded 3745 IV fluids Afebrile, BP is up Labs are stable AM film is pending Intake/Output from previous day: 04/18 0701 - 04/19 0700 In: 3745 [I.V.:3625] Out: 1200 [Emesis/NG output:1200] Intake/Output this shift:    General appearance: alert, cooperative, no distress and  a little anxious with NG tube. GI: soft, BS hyperactive ?flatus/? BM.  she is not distended or tender.  Lab Results:   Recent Labs  12/22/15 0518 12/24/15 0530  WBC 4.3 5.7  HGB 8.7* 8.8*  HCT 27.9* 28.0*  PLT 261 300    BMET  Recent Labs  12/22/15 0518 12/24/15 0530  NA 142 143  K 4.1 4.1  CL 110 110  CO2 24 25  GLUCOSE 124* 93  BUN <5* <5*  CREATININE 0.71 0.71  CALCIUM 8.6* 8.8*   PT/INR No results for input(s): LABPROT, INR in the last 72 hours.   Recent Labs Lab 12/18/15 1808 12/19/15 0519 12/20/15 0531  AST 12* 11* 17  ALT 11* 9* 14  ALKPHOS 88 73 73  BILITOT 1.4* 0.5 0.9  PROT 6.9 5.7* 6.1*  ALBUMIN 3.5 2.8* 3.0*     Lipase     Component Value Date/Time   LIPASE 24 12/18/2015 1808     Studies/Results: Dg Abd 1 View  12/23/2015  CLINICAL DATA:  NG tube placement EXAM: ABDOMEN - 1 VIEW COMPARISON:  12/23/2015 FINDINGS: Enteric tube tip and side port project over the expected location of the gastric antrum. There is persistent marked gas distention of multiple loops of small bowel with index loop of small bowel within in the left mid abdomen measuring approximately 7.2 cm in  diameter. Nondiagnostic evaluation for pneumoperitoneum secondary to supine positioning and exclusion a lower thorax. No definite pneumatosis. Nondiagnostic evaluation for portal venous gas given exclusion of the right upper abdominal quadrant. Multiple phleboliths overlie the lower pelvis bilaterally. Otherwise, no definitive abnormal intra-abdominal calcifications. Post left total hip replacement. Degenerative change of the lower lumbar spine and right hip is suspected though incompletely evaluated. IMPRESSION: 1. Enteric tube tip and side port project over the expected location of the gastric antrum. 2. Findings worrisome for high-grade small-bowel obstruction. Electronically Signed   By: Simonne ComeJohn  Watts M.D.   On: 12/23/2015 18:58   Dg Abd 2 Views  12/23/2015  CLINICAL DATA:  Abdominal pain and known small bowel dilatation EXAM: ABDOMEN - 2 VIEW COMPARISON:  12/21/2015 FINDINGS: Scattered large and small bowel gas is noted. Multiple dilated loops of small bowel are again identified. Decubitus view shows no evidence of free air. No acute bony abnormality is noted. Left hip prosthesis is seen. IMPRESSION: Persistent small bowel dilatation consistent with a partial small bowel obstruction. Some colonic gas is seen similar to that noted on the prior exam. Electronically Signed   By: Alcide CleverMark  Lukens M.D.   On: 12/23/2015 09:38    Medications: . alum & mag hydroxide-simeth  30 mL Oral Once  . enoxaparin (LOVENOX)  injection  40 mg Subcutaneous Q24H  . polyethylene glycol  17 g Oral BID    Assessment/Plan SBO with hx of perforated diverticulitis and prior hernia repair with mesh. Hx of diverticulitis Hx of Migraines 2-6 per month now Hypertension Antibiotics:  None DVT:  Lovenox/SCD    Plan:  Film is pending, regulator on suction shows no indication of the suction level. Nurse is changing that now. Start SB protocol this AM. They have not done 0700 films ordered so I just canceled them for now in place of  protocol.  LOS: 6 days    Hope Holst 12/24/2015 [phone]

## 2015-12-25 ENCOUNTER — Inpatient Hospital Stay (HOSPITAL_COMMUNITY): Payer: Medicare Other

## 2015-12-25 ENCOUNTER — Other Ambulatory Visit: Payer: Self-pay | Admitting: *Deleted

## 2015-12-25 LAB — HEPATIC FUNCTION PANEL
ALBUMIN: 2.8 g/dL — AB (ref 3.5–5.0)
ALT: 15 U/L (ref 14–54)
AST: 16 U/L (ref 15–41)
Alkaline Phosphatase: 64 U/L (ref 38–126)
BILIRUBIN TOTAL: 0.7 mg/dL (ref 0.3–1.2)
Bilirubin, Direct: <0.1 mg/dL — ABNORMAL LOW (ref 0.1–0.5)
TOTAL PROTEIN: 5.4 g/dL — AB (ref 6.5–8.1)

## 2015-12-25 LAB — BASIC METABOLIC PANEL
ANION GAP: 7 (ref 5–15)
BUN: 6 mg/dL (ref 6–20)
CALCIUM: 8.4 mg/dL — AB (ref 8.9–10.3)
CO2: 22 mmol/L (ref 22–32)
CREATININE: 0.67 mg/dL (ref 0.44–1.00)
Chloride: 112 mmol/L — ABNORMAL HIGH (ref 101–111)
Glucose, Bld: 81 mg/dL (ref 65–99)
Potassium: 3.9 mmol/L (ref 3.5–5.1)
SODIUM: 141 mmol/L (ref 135–145)

## 2015-12-25 MED ORDER — LORAZEPAM 2 MG/ML IJ SOLN
0.5000 mg | Freq: Once | INTRAMUSCULAR | Status: AC
  Administered 2015-12-25: 0.5 mg via INTRAVENOUS
  Filled 2015-12-25: qty 1

## 2015-12-25 MED ORDER — IBUPROFEN 400 MG PO TABS
400.0000 mg | ORAL_TABLET | Freq: Once | ORAL | Status: AC
  Administered 2015-12-25: 400 mg via ORAL
  Filled 2015-12-25: qty 1
  Filled 2015-12-25: qty 2

## 2015-12-25 NOTE — Consult Note (Addendum)
   [facility]   12/25/2015  Melinda Washington 1940-10-19 [id number]   Patient evaluated for long-term disease management services [facility]. Melinda FlesherWent to bedside again to discuss and offer [facility]. Patient is agreeable and written consent signed. Explained to patient that he/she will receive post hospital transition of care calls and will be evaluated for monthly home visits. Confirmed Primary Care MD as Dr. Aaron Washington.  Confirmed best contact number as [phone].Marland Kitchen.  [facility] and contact information at bedside. Will make inpatient RNCM aware that [facility]. Patient endorses she could use additional follow up on her blood pressure. She states it has been elevated but she thinks it is situational. Reports she plans on obtaining another blood pressure cuff as she can not find the one she had at home. Encouraged her to see her Primary Care MD soon after discharge as well. She is agreeable to this and appreciative of follow up. She also reports she lives alone. Noted she has x2 admissions in the past 6 months. Will [facility] for [facility] of care and follow up on hypertension.   Melinda NobleAtika Artur Winningham, MSN-Ed, RN,BSN [facility] Hospital Liaison [phone]

## 2015-12-25 NOTE — Progress Notes (Addendum)
PROGRESS NOTE    KHAMORA KARAN  ZOX:[id number] DOB: [birth year] DOA: 12/18/2015  PCP: Elvina Sidle, MD    Brief Narrative:  HERMAN MELL is a 75 y.o. female with a past medical history of diverticulosis, hypertension, anxiety, migraine headaches, osteoarthritis, and the emergency department with abdominal pain. Imaging in ER revealed SBO.   Assessment & Plan:   Principal Problem:   SBO (small bowel obstruction)   - Gen surgery following- clamp NG today and give trial of clears -cont IVF for now  Active Problems:   Migraine - given PRN Imitrex - resolved  HTN  - she states she was not taking Losartan at home- PRN Hydralazine   DVT prophylaxis: Lovenox Code Status: full code Family Communication:  Disposition Plan: home when tolerating diet   Consultants:   gen surgery  Procedures:   none  Antimicrobials:  Anti-infectives    None      Subjective: No nausea. No pain in abdomen. No BM this AM   Objective: Filed Vitals:   12/24/15 0535 12/24/15 1502 12/24/15 2230 12/25/15 0557  BP: 160/94 138/92 151/94 155/94  Pulse: 84 85 87 84  Temp: 98.6 F (37 C) 98.3 F (36.8 C) 97.5 F (36.4 C) 98.4 F (36.9 C)  TempSrc: Oral Oral Oral Oral  Resp: Height:      Weight:      SpO2: 96% 99% 98% 96%    Intake/Output Summary (Last 24 hours) at 12/25/15 1211 Last data filed at 12/25/15 0700  Gross per 24 hour  Intake 2598.33 ml  Output    450 ml  Net 2148.33 ml   Filed Weights   12/18/15 2305  Weight: 66.089 kg (145 lb 11.2 oz)    Examination: General exam: Appears calm and comfortable  Respiratory system: Clear to auscultation. Respiratory effort normal. Cardiovascular system: S1 & S2 heard, RRR. No JVD, murmurs, rubs, gallops or clicks. No pedal edema. Gastrointestinal system: Abdomen is nondistended, soft and nontender. No organomegaly or masses felt. Good bowel sounds Central nervous system: Alert and oriented. No focal  neurological deficits. Extremities: Symmetric 5 x 5 power. Skin: No rashes, lesions or ulcers Psychiatry: Judgement and insight appear normal. Mood & affect appropriate.     Data Reviewed: I have personally reviewed following labs and imaging studies  CBC:  Recent Labs Lab 12/18/15 1808 12/19/15 0519 12/20/15 0531 12/22/15 0518 12/24/15 0530  WBC 9.0 4.7 4.0 4.3 5.7  NEUTROABS 7.8* 3.2 2.3  --   --   HGB 9.9* 8.4* 9.6* 8.7* 8.8*  HCT 31.5* 27.1* 31.0* 27.9* 28.0*  MCV 83.8 83.9 86.4 84.0 85.4  PLT 285 240 288 261 300   Basic Metabolic Panel:  Recent Labs Lab 12/18/15 1825 12/19/15 0519 12/20/15 0531 12/22/15 0518 12/24/15 0530 12/25/15 0527  NA  --  144 142 142 143 141  K  --  3.6 4.2 4.1 4.1 3.9  CL  --  108 110 110 110 112*  CO2  --  29 21* GLUCOSE  --  122* 70 124* 93 81  BUN  --  26* 15 <5* <5* 6  CREATININE  --  0.84 0.75 0.71 0.71 0.67  CALCIUM  --  8.5* 8.7* 8.6* 8.8* 8.4*  MG 2.4  --   --   --   --   --   PHOS 2.1*  --   --   --   --   --    GFR:  Estimated Creatinine Clearance: 58.8 mL/min (by C-G formula based on Cr of 0.67). Liver Function Tests:  Recent Labs Lab 12/18/15 1808 12/19/15 0519 12/20/15 0531  AST 12* 11* 17  ALT 11* 9* 14  ALKPHOS 88 73 73  BILITOT 1.4* 0.5 0.9  PROT 6.9 5.7* 6.1*  ALBUMIN 3.5 2.8* 3.0*    Recent Labs Lab 12/18/15 1808  LIPASE 24   No results for input(s): AMMONIA in the last 168 hours. Coagulation Profile: No results for input(s): INR, PROTIME in the last 168 hours. Cardiac Enzymes: No results for input(s): CKTOTAL, CKMB, CKMBINDEX, TROPONINI in the last 168 hours. BNP (last 3 results) No results for input(s): PROBNP in the last 8760 hours. HbA1C: No results for input(s): HGBA1C in the last 72 hours. CBG: No results for input(s): GLUCAP in the last 168 hours. Lipid Profile: No results for input(s): CHOL, HDL, LDLCALC, TRIG, CHOLHDL, LDLDIRECT in the last 72 hours. Thyroid Function  Tests: No results for input(s): TSH, T4TOTAL, FREET4, T3FREE, THYROIDAB in the last 72 hours. Anemia Panel: No results for input(s): VITAMINB12, FOLATE, FERRITIN, TIBC, IRON, RETICCTPCT in the last 72 hours. Urine analysis:    Component Value Date/Time   COLORURINE YELLOW 12/18/2015 1800   APPEARANCEUR CLOUDY* 12/18/2015 1800   LABSPEC 1.027 12/18/2015 1800   PHURINE 8.0 12/18/2015 1800   GLUCOSEU NEGATIVE 12/18/2015 1800   HGBUR NEGATIVE 12/18/2015 1800   BILIRUBINUR NEGATIVE 12/18/2015 1800   BILIRUBINUR negative 05/05/2015 2154   KETONESUR >80* 12/18/2015 1800   PROTEINUR 100* 12/18/2015 1800   PROTEINUR negative 05/05/2015 2154   UROBILINOGEN 0.2 05/05/2015 2154   NITRITE NEGATIVE 12/18/2015 1800   NITRITE negative 05/05/2015 2154   LEUKOCYTESUR TRACE* 12/18/2015 1800   Sepsis Labs: @LABRCNTIP (procalcitonin:4,lacticidven:4)  ) Recent Results (from the past 240 hour(s))  Urine culture     Status: None   Collection Time: 12/18/15  6:00 PM  Result Value Ref Range Status   Specimen Description URINE, CLEAN CATCH  Final   Special Requests NONE  Final   Culture   Final    NO GROWTH 1 DAY [facility]    Report Status 12/20/2015 FINAL  Final         Radiology Studies: Dg Abd 1 View  12/25/2015  CLINICAL DATA:  Recent bowel obstruction. Persistent abdominal distention EXAM: ABDOMEN - 1 VIEW COMPARISON:  December 24, 2015 and December 23, 2015 FINDINGS: Nasogastric tube tip and side port are in the distal stomach. There remain loops of dilated small bowel, stable. There is contrast in the colon, unchanged. 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 No free air is evident. However, there is air seen in the common bile duct. IMPRESSION: Nasogastric tube tip and side port in distal stomach. There remain loops of dilated bowel, suggesting residual degree of partial obstruction. Very little change from 1 day prior. No free air evident. There is air apparent in the  common bile duct. Question whether patient has had a previous sphincterotomy procedure. Electronically Signed   By: Bretta BangWilliam  Woodruff III M.D.   On: 12/25/2015 09:13   Dg Abd 1 View  12/23/2015  CLINICAL DATA:  NG tube placement EXAM: ABDOMEN - 1 VIEW COMPARISON:  12/23/2015 FINDINGS: Enteric tube tip and side port project over the expected location of the gastric antrum. There is persistent marked gas distention of multiple loops of small bowel with index loop of small bowel within in the left mid abdomen measuring approximately 7.2 cm in diameter.  Nondiagnostic evaluation for pneumoperitoneum secondary to supine positioning and exclusion a lower thorax. No definite pneumatosis. Nondiagnostic evaluation for portal venous gas given exclusion of the right upper abdominal quadrant. Multiple phleboliths overlie the lower pelvis bilaterally. Otherwise, no definitive abnormal intra-abdominal calcifications. Post left total hip replacement. Degenerative change of the lower lumbar spine and right hip is suspected though incompletely evaluated. IMPRESSION: 1. Enteric tube tip and side port project over the expected location of the gastric antrum. 2. Findings worrisome for high-grade small-bowel obstruction. Electronically Signed   By: Simonne Come M.D.   On: 12/23/2015 18:58   Dg Abd Portable 1v-small Bowel Obstruction Protocol-initial, 8 Hr Delay  12/24/2015  CLINICAL DATA:  Small bowel obstruction. EXAM: PORTABLE ABDOMEN - 1 VIEW COMPARISON:  Radiographs 12/23/2015. FINDINGS: The NG tube tip is in the antropyloric region of the stomach. Interval decrease and small bowel dilatation and progression of contrast into the colon. Findings suggest a resolving small bowel obstruction. No free air. IMPRESSION: Resolving small bowel obstruction. Electronically Signed   By: Rudie Meyer M.D.   On: 12/24/2015 18:25        Scheduled Meds: . alum & mag hydroxide-simeth  30 mL Oral Once  . enoxaparin (LOVENOX) injection   40 mg Subcutaneous Q24H  . polyethylene glycol  17 g Oral BID   Continuous Infusions: . 0.9 % NaCl with KCl 20 mEq / L 50 mL/hr at 12/25/15 1203     LOS: 7 days    Time spent in minutes: 35    Doretta Remmert, MD [facility] Pager: [email or web address] Password [email or web address] 12/25/2015, 12:11 PM

## 2015-12-25 NOTE — Progress Notes (Signed)
Subjective: She is better, it seems she tries salt water flushes when she thinks something is occuring and it did not work this time.  She has BS and is having BM's.  Objective: Vital signs in last 24 hours: Temp:  [97.5 F (36.4 C)-98.4 F (36.9 C)] 98.4 F (36.9 C) (04/20 0557) Pulse Rate:  [84-87] 84 (04/20 0557) Resp:  [18-20] 18 (04/20 0557) BP: (138-155)/(92-94) 155/94 mmHg (04/20 0557) SpO2:  [96 %-99 %] 96 % (04/20 0557) Last BM Date: 12/25/15 NG 450 Stool x 4 Voided x 8 Afebrile, VSS, BP still up Labs OK Film shows contrast in the colon   Intake/Output from previous day: 04/19 0701 - 04/20 0700 In: 2598.3 [I.V.:2358.3] Out: 450 [Emesis/NG output:450] Intake/Output this shift:    General appearance: alert, cooperative and no distress GI: soft, non-tender; bowel sounds normal; no masses,  no organomegaly  Lab Results:   Recent Labs  12/24/15 0530  WBC 5.7  HGB 8.8*  HCT 28.0*  PLT 300    BMET  Recent Labs  12/24/15 0530 12/25/15 0527  NA 143 141  K 4.1 3.9  CL 110 112*  CO2 25 22  GLUCOSE 93 81  BUN <5* 6  CREATININE 0.71 0.67  CALCIUM 8.8* 8.4*   PT/INR No results for input(s): LABPROT, INR in the last 72 hours.   Recent Labs Lab 12/18/15 1808 12/19/15 0519 12/20/15 0531  AST 12* 11* 17  ALT 11* 9* 14  ALKPHOS 88 73 73  BILITOT 1.4* 0.5 0.9  PROT 6.9 5.7* 6.1*  ALBUMIN 3.5 2.8* 3.0*     Lipase     Component Value Date/Time   LIPASE 24 12/18/2015 1808     Studies/Results: Dg Abd 1 View  12/25/2015  CLINICAL DATA:  Recent bowel obstruction. Persistent abdominal distention EXAM: ABDOMEN - 1 VIEW COMPARISON:  December 24, 2015 and December 23, 2015 FINDINGS: Nasogastric tube tip and side port are in the distal stomach. There remain loops of dilated small bowel, stable. There is contrast in the colon, unchanged. 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 No free air is evident. However, there is air seen in the common bile duct.  IMPRESSION: Nasogastric tube tip and side port in distal stomach. There remain loops of dilated bowel, suggesting residual degree of partial obstruction. Very little change from 1 day prior. No free air evident. There is air apparent in the common bile duct. Question whether patient has had a previous sphincterotomy procedure. Electronically Signed   By: Bretta Bang III M.D.   On: 12/25/2015 09:13   Dg Abd 1 View  12/23/2015  CLINICAL DATA:  NG tube placement EXAM: ABDOMEN - 1 VIEW COMPARISON:  12/23/2015 FINDINGS: Enteric tube tip and side port project over the expected location of the gastric antrum. There is persistent marked gas distention of multiple loops of small bowel with index loop of small bowel within in the left mid abdomen measuring approximately 7.2 cm in diameter. Nondiagnostic evaluation for pneumoperitoneum secondary to supine positioning and exclusion a lower thorax. No definite pneumatosis. Nondiagnostic evaluation for portal venous gas given exclusion of the right upper abdominal quadrant. Multiple phleboliths overlie the lower pelvis bilaterally. Otherwise, no definitive abnormal intra-abdominal calcifications. Post left total hip replacement. Degenerative change of the lower lumbar spine and right hip is suspected though incompletely evaluated. IMPRESSION: 1. Enteric tube tip and side port project over the expected location of the gastric antrum. 2. Findings worrisome for high-grade small-bowel obstruction. Electronically Signed  By: Simonne ComeJohn  Watts M.D.   On: 12/23/2015 18:58   Dg Abd Portable 1v-small Bowel Obstruction Protocol-initial, 8 Hr Delay  12/24/2015  CLINICAL DATA:  Small bowel obstruction. EXAM: PORTABLE ABDOMEN - 1 VIEW COMPARISON:  Radiographs 12/23/2015. FINDINGS: The NG tube tip is in the antropyloric region of the stomach. Interval decrease and small bowel dilatation and progression of contrast into the colon. Findings suggest a resolving small bowel obstruction. No  free air. IMPRESSION: Resolving small bowel obstruction. Electronically Signed   By: Rudie MeyerP.  Gallerani M.D.   On: 12/24/2015 18:25    Medications: . alum & mag hydroxide-simeth  30 mL Oral Once  . enoxaparin (LOVENOX) injection  40 mg Subcutaneous Q24H  . polyethylene glycol  17 g Oral BID    Assessment/Plan SBO with hx of perforated diverticulitis and prior hernia repair with mesh. Hx of diverticulitis Hx of Migraines 2-6 per month now Hypertension Antibiotics: None DVT: Lovenox/SCD    Plan:  Discussed with Dr. Butler Denmarkizwan.  Clamp NG if she does well we can pull later this afternoon, and advance diet as tolerated.     LOS: 7 days    Melinda Washington 12/25/2015 [phone]

## 2015-12-26 MED ORDER — POLYETHYLENE GLYCOL 3350 17 G PO PACK
17.0000 g | PACK | Freq: Every day | ORAL | Status: DC | PRN
  Administered 2015-12-26 – 2015-12-28 (×2): 17 g via ORAL
  Filled 2015-12-26 (×2): qty 1

## 2015-12-26 NOTE — Progress Notes (Signed)
Pt encouraged to walk in hallway.  Pt refused at this time.  Pt educated on benefits and rationale for ambulation to progress status.

## 2015-12-26 NOTE — Progress Notes (Signed)
PROGRESS NOTE    Melinda SawyersRosemarie W Washington  ZOX:[id number] DOB: [birth year] DOA: 12/18/2015  PCP: Elvina SidleLAUENSTEIN,KURT, MD    Brief Narrative:  Melinda SawyersRosemarie W Marcellus is a 75 y.o. female with a past medical history of diverticulosis, hypertension, anxiety, migraine headaches, osteoarthritis, and the emergency department with abdominal pain. Imaging in ER revealed SBO.   Assessment & Plan:   Principal Problem:   SBO (small bowel obstruction)   - Gen surgery following- advance to soft diet at dinner -d/c VF    Active Problems:   Migraine - given PRN Imitrex - resolved  HTN  - she states she was not taking Losartan at home- PRN Hydralazine   DVT prophylaxis: Lovenox Code Status: full code Family Communication:  Disposition Plan: home when tolerating diet   Consultants:   gen surgery  Procedures:   none  Antimicrobials:  Anti-infectives    None      Subjective: No nausea. No pain in abdomen. No BM this AM   Objective: Filed Vitals:   12/25/15 0557 12/25/15 1519 12/25/15 2148 12/26/15 0516  BP: 155/94 156/99 159/99 144/90  Pulse: 84 90 89 89  Temp: 98.4 F (36.9 C) 98.6 F (37 C) 99.8 F (37.7 C) 98.6 F (37 C)  TempSrc: Oral Oral Oral Oral  Resp: 18 18 18 20   Height:      Weight:      SpO2: 96% 98% 98% 97%    Intake/Output Summary (Last 24 hours) at 12/26/15 1256 Last data filed at 12/26/15 0600  Gross per 24 hour  Intake 1137.5 ml  Output    850 ml  Net  287.5 ml   Filed Weights   12/18/15 2305  Weight: 66.089 kg (145 lb 11.2 oz)    Examination: General exam: Appears calm and comfortable  Respiratory system: Clear to auscultation. Respiratory effort normal. Cardiovascular system: S1 & S2 heard, RRR. No JVD, murmurs, rubs, gallops or clicks. No pedal edema. Gastrointestinal system: Abdomen is nondistended, soft and nontender. No organomegaly or masses felt. Good bowel sounds Central nervous system: Alert and oriented. No focal neurological  deficits. Extremities: Symmetric 5 x 5 power. Skin: No rashes, lesions or ulcers Psychiatry: Judgement and insight appear normal. Mood & affect appropriate.     Data Reviewed: I have personally reviewed following labs and imaging studies  CBC:  Recent Labs Lab 12/20/15 0531 12/22/15 0518 12/24/15 0530  WBC 4.0 4.3 5.7  NEUTROABS 2.3  --   --   HGB 9.6* 8.7* 8.8*  HCT 31.0* 27.9* 28.0*  MCV 86.4 84.0 85.4  PLT 288 261 300   Basic Metabolic Panel:  Recent Labs Lab 12/20/15 0531 12/22/15 0518 12/24/15 0530 12/25/15 0527  NA 142 142 143 141  K 4.2 4.1 4.1 3.9  CL 110 110 110 112*  CO2 21* 24 25 22   GLUCOSE 70 124* 93 81  BUN 15 <5* <5* 6  CREATININE 0.75 0.71 0.71 0.67  CALCIUM 8.7* 8.6* 8.8* 8.4*   GFR: Estimated Creatinine Clearance: 58.8 mL/min (by C-G formula based on Cr of 0.67). Liver Function Tests:  Recent Labs Lab 12/20/15 0531 12/25/15 0527  AST 17 16  ALT 14 15  ALKPHOS 73 64  BILITOT 0.9 0.7  PROT 6.1* 5.4*  ALBUMIN 3.0* 2.8*   No results for input(s): LIPASE, AMYLASE in the last 168 hours. No results for input(s): AMMONIA in the last 168 hours. Coagulation Profile: No results for input(s): INR, PROTIME in the last 168 hours. Cardiac Enzymes: No results for  input(s): CKTOTAL, CKMB, CKMBINDEX, TROPONINI in the last 168 hours. BNP (last 3 results) No results for input(s): PROBNP in the last 8760 hours. HbA1C: No results for input(s): HGBA1C in the last 72 hours. CBG: No results for input(s): GLUCAP in the last 168 hours. Lipid Profile: No results for input(s): CHOL, HDL, LDLCALC, TRIG, CHOLHDL, LDLDIRECT in the last 72 hours. Thyroid Function Tests: No results for input(s): TSH, T4TOTAL, FREET4, T3FREE, THYROIDAB in the last 72 hours. Anemia Panel: No results for input(s): VITAMINB12, FOLATE, FERRITIN, TIBC, IRON, RETICCTPCT in the last 72 hours. Urine analysis:    Component Value Date/Time   COLORURINE YELLOW 12/18/2015 1800    APPEARANCEUR CLOUDY* 12/18/2015 1800   LABSPEC 1.027 12/18/2015 1800   PHURINE 8.0 12/18/2015 1800   GLUCOSEU NEGATIVE 12/18/2015 1800   HGBUR NEGATIVE 12/18/2015 1800   BILIRUBINUR NEGATIVE 12/18/2015 1800   BILIRUBINUR negative 05/05/2015 2154   KETONESUR >80* 12/18/2015 1800   PROTEINUR 100* 12/18/2015 1800   PROTEINUR negative 05/05/2015 2154   UROBILINOGEN 0.2 05/05/2015 2154   NITRITE NEGATIVE 12/18/2015 1800   NITRITE negative 05/05/2015 2154   LEUKOCYTESUR TRACE* 12/18/2015 1800   Sepsis Labs: (procalcitonin:4,lacticidven:4)  ) Recent Results (from the past 240 hour(s))  Urine culture     Status: None   Collection Time: 12/18/15  6:00 PM  Result Value Ref Range Status   Specimen Description URINE, CLEAN CATCH  Final   Special Requests NONE  Final   Culture   Final    NO GROWTH 1 DAY Performed at [facility]    Report Status 12/20/2015 FINAL  Final         Radiology Studies: Dg Abd 1 View  12/25/2015  CLINICAL DATA:  Recent bowel obstruction. Persistent abdominal distention EXAM: ABDOMEN - 1 VIEW COMPARISON:  December 24, 2015 and December 23, 2015 FINDINGS: Nasogastric tube tip and side port are in the distal stomach. There remain loops of dilated small bowel, stable. There is contrast in the colon, unchanged. 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 No free air is evident. However, there is air seen in the common bile duct. IMPRESSION: Nasogastric tube tip and side port in distal stomach. There remain loops of dilated bowel, suggesting residual degree of partial obstruction. Very little change from 1 day prior. No free air evident. There is air apparent in the common bile duct. Question whether patient has had a previous sphincterotomy procedure. Electronically Signed   By: Bretta Bang III M.D.   On: 12/25/2015 09:13   Dg Abd Portable 1v-small Bowel Obstruction Protocol-initial, 8 Hr Delay  12/24/2015  CLINICAL DATA:  Small bowel obstruction.  EXAM: PORTABLE ABDOMEN - 1 VIEW COMPARISON:  Radiographs 12/23/2015. FINDINGS: The NG tube tip is in the antropyloric region of the stomach. Interval decrease and small bowel dilatation and progression of contrast into the colon. Findings suggest a resolving small bowel obstruction. No free air. IMPRESSION: Resolving small bowel obstruction. Electronically Signed   By: Rudie Meyer M.D.   On: 12/24/2015 18:25        Scheduled Meds: . alum & mag hydroxide-simeth  30 mL Oral Once  . enoxaparin (LOVENOX) injection  40 mg Subcutaneous Q24H   Continuous Infusions: . 0.9 % NaCl with KCl 20 mEq / L 50 mL/hr at 12/25/15 1900     LOS: 8 days    Time spent in minutes: 35    Souleymane Saiki, MD [facility] Pager: [email or web address] Password [facility] 12/26/2015, 12:56 PM

## 2015-12-26 NOTE — Progress Notes (Signed)
Pt encouraged again to walk hallway.  Pt states will walk after eating lunch.

## 2015-12-26 NOTE — Progress Notes (Signed)
Subjective: She is complaining of her throat being sore, but wanted to retain the NG tube last PM.  She just had some full liquids and seems to be doing well with it, + BS, stools all liquid in nature.  No distension, nausea or vomiting.  Objective: Vital signs in last 24 hours: Temp:  [98.6 F (37 C)-99.8 F (37.7 C)] 98.6 F (37 C) (04/21 0516) Pulse Rate:  [89-90] 89 (04/21 0516) Resp:  [18-20] 20 (04/21 0516) BP: (144-159)/(90-99) 144/90 mmHg (04/21 0516) SpO2:  [97 %-98 %] 97 % (04/21 0516) Last BM Date: 12/25/15 600 PO Urine 250 recorded Stool 500 recorded Afebrile, BP still up some, No labs No film Intake/Output from previous day: 04/20 0701 - 04/21 0700 In: 1997.5 [P.O.:600; I.V.:1397.5] Out: 850 [Urine:250; Emesis/NG output:100; Stool:500] Intake/Output this shift:    General appearance: alert, cooperative and no distress GI: soft, non-tender; bowel sounds normal; no masses,  no organomegaly  Lab Results:   Recent Labs  12/24/15 0530  WBC 5.7  HGB 8.8*  HCT 28.0*  PLT 300    BMET  Recent Labs  12/24/15 0530 12/25/15 0527  NA 143 141  K 4.1 3.9  CL 110 112*  CO2 25 22  GLUCOSE 93 81  BUN <5* 6  CREATININE 0.71 0.67  CALCIUM 8.8* 8.4*   PT/INR No results for input(s): LABPROT, INR in the last 72 hours.   Recent Labs Lab 12/20/15 0531 12/25/15 0527  AST 17 16  ALT 14 15  ALKPHOS 73 64  BILITOT 0.9 0.7  PROT 6.1* 5.4*  ALBUMIN 3.0* 2.8*     Lipase     Component Value Date/Time   LIPASE 24 12/18/2015 1808     Studies/Results: Dg Abd 1 View  12/25/2015  CLINICAL DATA:  Recent bowel obstruction. Persistent abdominal distention EXAM: ABDOMEN - 1 VIEW COMPARISON:  December 24, 2015 and December 23, 2015 FINDINGS: Nasogastric tube tip and side port are in the distal stomach. There remain loops of dilated small bowel, stable. There is contrast in the colon, unchanged. 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 No free air is evident.  However, there is air seen in the common bile duct. IMPRESSION: Nasogastric tube tip and side port in distal stomach. There remain loops of dilated bowel, suggesting residual degree of partial obstruction. Very little change from 1 day prior. No free air evident. There is air apparent in the common bile duct. Question whether patient has had a previous sphincterotomy procedure. Electronically Signed   By: Bretta Bang III M.D.   On: 12/25/2015 09:13   Dg Abd Portable 1v-small Bowel Obstruction Protocol-initial, 8 Hr Delay  12/24/2015  CLINICAL DATA:  Small bowel obstruction. EXAM: PORTABLE ABDOMEN - 1 VIEW COMPARISON:  Radiographs 12/23/2015. FINDINGS: The NG tube tip is in the antropyloric region of the stomach. Interval decrease and small bowel dilatation and progression of contrast into the colon. Findings suggest a resolving small bowel obstruction. No free air. IMPRESSION: Resolving small bowel obstruction. Electronically Signed   By: Rudie Meyer M.D.   On: 12/24/2015 18:25    Medications: . alum & mag hydroxide-simeth  30 mL Oral Once  . enoxaparin (LOVENOX) injection  40 mg Subcutaneous Q24H  . polyethylene glycol  17 g Oral BID    Assessment/Plan SBO with hx of perforated diverticulitis and prior hernia repair with mesh. Hx of diverticulitis Hx of Migraines 2-6 per month now Hypertension Antibiotics: None DVT: Lovenox/SCD    Plan:  D/C NG, leave her on full liquids for lunch, soft diet at supper.  We can hold up on the Miralax.       LOS: 8 days    Richelle Glick 12/26/2015 [phone]

## 2015-12-27 ENCOUNTER — Inpatient Hospital Stay (HOSPITAL_COMMUNITY): Payer: Medicare Other

## 2015-12-27 MED ORDER — IBUPROFEN 400 MG PO TABS
400.0000 mg | ORAL_TABLET | Freq: Once | ORAL | Status: AC
  Administered 2015-12-27: 400 mg via ORAL
  Filled 2015-12-27: qty 1
  Filled 2015-12-27: qty 2

## 2015-12-27 MED ORDER — BISACODYL 10 MG RE SUPP
10.0000 mg | Freq: Once | RECTAL | Status: AC
  Administered 2015-12-27: 10 mg via RECTAL
  Filled 2015-12-27: qty 1

## 2015-12-27 NOTE — Progress Notes (Signed)
[facility]:  [phone] General Surgery Progress Note   LOS: 9 days  POD -     Assessment/Plan: 1.  Partial SBO  hx of perforated diverticulitis and prior hernia repair with mesh.  Taking reg diet, though feels mildly bloated and has not had a BM.  She is passing flatus.  2.  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  It sounds like she has been doing this for 5 years.  2.  Hx of diverticulitis 3.  Hx of Migraines 2-6 per month now 4.  Hypertension 5.  DVT prophylaxis - Lovenox   Principal Problem:   SBO (small bowel obstruction) (HCC) Active Problems:   Migraine   Essential hypertension   Diverticulosis   Subjective:  Quite talkative.  Discussed her Mg Citrate tabs that she takes, he "chronic" bowel issues.  Objective:   Filed Vitals:   12/27/15 0609 12/27/15 0613  BP: 154/100 163/98  Pulse: 83   Temp: 98.8 F (37.1 C)   Resp: 18      Intake/Output from previous day:  04/21 0701 - 04/22 0700 In: 240 [P.O.:240] Out: -   Intake/Output this shift:      Physical Exam:   General: Older WF who is alert and oriented.    HEENT: Normal. Pupils equal. .   Lungs: clear   Abdomen: Mild distention.  Has good BS.     Lab Results:   No results for input(s): WBC, HGB, HCT, PLT in the last 72 hours.  BMET   Recent Labs  12/25/15 0527  NA 141  K 3.9  CL 112*  CO2 22  GLUCOSE 81  BUN 6  CREATININE 0.67  CALCIUM 8.4*    PT/INR  No results for input(s): LABPROT, INR in the last 72 hours.  ABG  No results for input(s): PHART, HCO3 in the last 72 hours.  Invalid input(s): PCO2, PO2   Studies/Results:  No results found.   Anti-infectives:   Anti-infectives    None      Ovidio Kinavid Coleta Grosshans, MD, FACS Pager: [phone] [facility]: [phone] 12/27/2015

## 2015-12-27 NOTE — Progress Notes (Signed)
PROGRESS NOTE    Melinda SawyersRosemarie W Livingstone  ZOX:[id number] DOB: 09/30/1940 DOA: 12/18/2015  PCP: Elvina SidleLAUENSTEIN,KURT, MD    Brief Narrative:  Melinda Washington is a 75 y.o. female with a past medical history of diverticulosis, hypertension, anxiety, migraine headaches, osteoarthritis, and the emergency department with abdominal pain. Imaging in ER revealed SBO.   Assessment & Plan:   Principal Problem:   SBO (small bowel obstruction)   - Gen surgery following- advanced to solids- give dulcolax suppository -d/c'd IVF    Active Problems:   Migraine - given PRN Imitrex - resolved  HTN  - she states she was not taking Losartan at home- PRN Hydralazine   DVT prophylaxis: Lovenox Code Status: full code Family Communication:  Disposition Plan: home when tolerating diet   Consultants:   gen surgery  Procedures:   none  Antimicrobials:  Anti-infectives    None      Subjective: No nausea. No pain in abdomen. Very upset that she has not had a BM yet. Feels that the solid food she ate yesterday is just sitting in hr belly. States her abdomen is bloated.   Objective: Filed Vitals:   12/26/15 1300 12/26/15 2100 12/27/15 0609 12/27/15 0613  BP: 142/90 145/94 154/100 163/98  Pulse: 93 97 83   Temp: 98.7 F (37.1 C) 99.1 F (37.3 C) 98.8 F (37.1 C)   TempSrc: Oral Oral Oral   Resp: 14 18 18    Height:      Weight:      SpO2: 94% 97% 96%     Intake/Output Summary (Last 24 hours) at 12/27/15 1235 Last data filed at 12/27/15 0803  Gross per 24 hour  Intake      0 ml  Output      0 ml  Net      0 ml   Filed Weights   12/18/15 2305  Weight: 66.089 kg (145 lb 11.2 oz)    Examination: General exam: Appears calm and comfortable  Respiratory system: Clear to auscultation. Respiratory effort normal. Cardiovascular system: S1 & S2 heard, RRR. No JVD, murmurs, rubs, gallops or clicks. No pedal edema. Gastrointestinal system: Abdomen is nondistended, soft and nontender. No  organomegaly or masses felt. Good bowel sounds Central nervous system: Alert and oriented. No focal neurological deficits. Extremities: Symmetric 5 x 5 power. Skin: No rashes, lesions or ulcers Psychiatry: Judgement and insight appear normal. Mood & affect appropriate.     Data Reviewed: I have personally reviewed following labs and imaging studies  CBC:  Recent Labs Lab 12/22/15 0518 12/24/15 0530  WBC 4.3 5.7  HGB 8.7* 8.8*  HCT 27.9* 28.0*  MCV 84.0 85.4  PLT 261 300   Basic Metabolic Panel:  Recent Labs Lab 12/22/15 0518 12/24/15 0530 12/25/15 0527  NA 142 143 141  K 4.1 4.1 3.9  CL 110 110 112*  CO2 24 25 22   GLUCOSE 124* 93 81  BUN <5* <5* 6  CREATININE 0.71 0.71 0.67  CALCIUM 8.6* 8.8* 8.4*   GFR: Estimated Creatinine Clearance: 58.8 mL/min (by C-G formula based on Cr of 0.67). Liver Function Tests:  Recent Labs Lab 12/25/15 0527  AST 16  ALT 15  ALKPHOS 64  BILITOT 0.7  PROT 5.4*  ALBUMIN 2.8*   No results for input(s): LIPASE, AMYLASE in the last 168 hours. No results for input(s): AMMONIA in the last 168 hours. Coagulation Profile: No results for input(s): INR, PROTIME in the last 168 hours. Cardiac Enzymes: No results for input(s):  CKTOTAL, CKMB, CKMBINDEX, TROPONINI in the last 168 hours. BNP (last 3 results) No results for input(s): PROBNP in the last 8760 hours. HbA1C: No results for input(s): HGBA1C in the last 72 hours. CBG: No results for input(s): GLUCAP in the last 168 hours. Lipid Profile: No results for input(s): CHOL, HDL, LDLCALC, TRIG, CHOLHDL, LDLDIRECT in the last 72 hours. Thyroid Function Tests: No results for input(s): TSH, T4TOTAL, FREET4, T3FREE, THYROIDAB in the last 72 hours. Anemia Panel: No results for input(s): VITAMINB12, FOLATE, FERRITIN, TIBC, IRON, RETICCTPCT in the last 72 hours. Urine analysis:    Component Value Date/Time   COLORURINE YELLOW 12/18/2015 1800   APPEARANCEUR CLOUDY* 12/18/2015 1800    LABSPEC 1.027 12/18/2015 1800   PHURINE 8.0 12/18/2015 1800   GLUCOSEU NEGATIVE 12/18/2015 1800   HGBUR NEGATIVE 12/18/2015 1800   BILIRUBINUR NEGATIVE 12/18/2015 1800   BILIRUBINUR negative 05/05/2015 2154   KETONESUR >80* 12/18/2015 1800   PROTEINUR 100* 12/18/2015 1800   PROTEINUR negative 05/05/2015 2154   UROBILINOGEN 0.2 05/05/2015 2154   NITRITE NEGATIVE 12/18/2015 1800   NITRITE negative 05/05/2015 2154   LEUKOCYTESUR TRACE* 12/18/2015 1800   Sepsis Labs: (procalcitonin:4,lacticidven:4)  Recent Results (from the past 240 hour(s))  Urine culture     Status: None   Collection Time: 12/18/15  6:00 PM  Result Value Ref Range Status   Specimen Description URINE, CLEAN CATCH  Final   Special Requests NONE  Final   Culture   Final    NO GROWTH 1 DAY Performed at [facility]    Report Status 12/20/2015 FINAL  Final         Radiology Studies: Dg Abd Portable 1v  12/27/2015  CLINICAL DATA:  Pt c/o abd discomfort when laying flat. F/u SBO, hx diverticulitis EXAM: PORTABLE ABDOMEN - 1 VIEW COMPARISON:  12/25/2015 FINDINGS: The right upper quadrant of the abdomen is excluded. Nasogastric tube is no longer apparent but may be off the image. There is moderate dilatation of small bowel loops, increased in diameter since the prior study. There has been evacuation colonic contrast. Left hip arthroplasty. IMPRESSION: Some persistent component of small bowel obstruction. Electronically Signed   By: Norva Pavlov M.D.   On: 12/27/2015 11:10        Scheduled Meds: . alum & mag hydroxide-simeth  30 mL Oral Once  . enoxaparin (LOVENOX) injection  40 mg Subcutaneous Q24H   Continuous Infusions:     LOS: 9 days    Time spent in minutes: 35    Merrilyn Legler, MD [facility] Pager: [email or web address] Password [facility] 12/27/2015, 12:35 PM

## 2015-12-28 LAB — BASIC METABOLIC PANEL
Anion gap: 9 (ref 5–15)
BUN: 10 mg/dL (ref 6–20)
CALCIUM: 8.7 mg/dL — AB (ref 8.9–10.3)
CO2: 23 mmol/L (ref 22–32)
CREATININE: 0.79 mg/dL (ref 0.44–1.00)
Chloride: 112 mmol/L — ABNORMAL HIGH (ref 101–111)
GFR calc Af Amer: >60 mL/min (ref >60)
GFR calc non Af Amer: >60 mL/min (ref >60)
GLUCOSE: 109 mg/dL — AB (ref 65–99)
Potassium: 3.7 mmol/L (ref 3.5–5.1)
Sodium: 144 mmol/L (ref 135–145)

## 2015-12-28 MED ORDER — POLYETHYLENE GLYCOL 3350 17 G PO PACK: 17.0000 g | PACK | Freq: Every day | ORAL | Status: AC | PRN

## 2015-12-28 NOTE — Discharge Summary (Signed)
Physician Discharge Summary  Melinda Washington ZOX:[id number] DOB: 24-Jun-1941 DOA: 12/18/2015  PCP: Elvina Sidle, MD  Admit date: 12/18/2015 Discharge date: 12/28/2015  Time spent: 50 minutes  Recommendations for Outpatient Follow-up:  1. Need referral if further bowel issues- has never had a colonoscopy  Discharge Condition: stable    Discharge Diagnoses:  Principal Problem:   SBO (small bowel obstruction) (HCC) Active Problems:   Migraine   Essential hypertension   Diverticulosis   History of present illness:  Melinda Washington is a 75 y.o. female with a past medical history of diverticulosis, diverticulitis with perforation, hernia repair with mesh, hypertension, anxiety, migraine headaches, osteoarthritis who presents to the emergency department with abdominal pain. Imaging in ER revealed SBO.  Hospital Course:  Principal Problem:  SBO (small bowel obstruction)  - managed with bowel rest and IV hydration - Gen surgery asked to see her on 4/18 as she had no improvement -NG tube placed and small bowel protocol done on 4/19- xray revealed partial SBO -started on clears by gen on 4/20-  advanced to solids on 4/21- having a small amount of stools (probably has very little solid food in her colon as she has barely eaten in 10 days) -  general surgery recommends a GI eval for slow gut motility if she has further issues - she has never had a colonoscopy- I am recommending she see her PCP in 1 wk to discuss this    Active Problems:  Migraines - given PRN Imitrex - resolved - she takes large doses of Mag citrate at home for migraines  HTN - she states she was not taking Losartan at home- PRN Hydralazine in hospital   Anemia - AOCD? PCP to follow  Consultations:  gen surgery  Discharge Exam: Filed Weights   12/18/15 2305  Weight: 66.089 kg (145 lb 11.2 oz)   Filed Vitals:   12/27/15 2125 12/28/15 0520  BP: 134/93 126/89  Pulse: 87 85  Temp: 99.1 F (37.3 C) 98.6  F (37 C)  Resp:  18    General: AAO x 3, no distress Cardiovascular: RRR, no murmurs  Respiratory: clear to auscultation bilaterally GI: soft, non-tender, non-distended, bowel sound positive  Discharge Instructions You were cared for by a hospitalist during your hospital stay. If you have any questions about your discharge medications or the care you received while you were in the hospital after you are discharged, you can call the unit and asked to speak with the hospitalist on call if the hospitalist that took care of you is not available. Once you are discharged, your primary care physician will handle any further medical issues. Please note that NO REFILLS for any discharge medications will be authorized once you are discharged, as it is imperative that you return to your primary care physician (or establish a relationship with a primary care physician if you do not have one) for your aftercare needs so that they can reassess your need for medications and monitor your lab values.      Discharge Instructions    AMB Referral to [facility] Care Management    Complete by:  As directed   Please assign to [facility] RNCM. Written consent obtained. Currently at [facility]. Has had x2 admits in past 6 months. Requests follow up on her HTN post discharge. Please call with questions. Raiford Noble, MSN-Ed, [facility] Liaison-[phone]  Reason for consult:  Please assign to [facility]  Expected date of contact:  1-3 days (reserved for  hospital discharges)     Discharge instructions    Complete by:  As directed   See your family doctor in 1 wk- you need to be referred to a GI doctor if you continue to have GI issues Take a low salt, low fat  for now     Increase activity slowly    Complete by:  As directed             Medication List    TAKE these medications        DIGESTIVE ENZYMES PO  Take 1 capsule by mouth 2 (two) times daily with a meal.     MAGNESIUM CITRATE PO  Take  1,200 mg by mouth daily.     polyethylene glycol packet  Commonly known as:  MIRALAX / GLYCOLAX  Take 17 g by mouth daily as needed (no BM for 24 hours).     PROBIOTIC DAILY PO  Take 1 capsule by mouth daily.     SUMAtriptan 100 MG tablet  Commonly known as:  IMITREX  Take 1 tablet (100 mg total) by mouth every 2 (two) hours as needed for migraine. May take one tablet on the onset of headache. Then repeat in 2 hours as needed. Maximum 2 doses in 24 hours     Turmeric Powd  Take 1,000 mg by mouth daily.     vitamin C 1000 MG tablet  Take 4,000 mg by mouth 2 (two) times daily.     Vitamin D3 1000 units Caps  Take 4,000 Units by mouth daily.     VITAMIN K2 PO  Take 80 mcg by mouth daily.     Vitamin-B Complex Tabs  Take 1 tablet by mouth daily.       Allergies  Allergen Reactions  . Sulfamethoxazole Rash  . Ciprofloxacin     Pt says it makes her feeling "very weak."   . Codeine Nausea And Vomiting    All pain medications cause some degree of nausea (moderate to severe)- able to tolerate when premedicated with antiemetic  . Ketorolac Tromethamine Anxiety    Tremors, teeth chattering  . Latex Rash    Redness & burning.  Marland Kitchen Penicillins Rash    Has patient had a PCN reaction causing immediate rash, facial/tongue/throat swelling, SOB or lightheadedness with hypotension: No Has patient had a PCN reaction causing severe rash involving mucus membranes or skin necrosis: No Has patient had a PCN reaction that required hospitalization No Has patient had a PCN reaction occurring within the last 10 years: No If all of the above answers are "NO", then may proceed with Cephalosporin use.       The results of significant diagnostics from this hospitalization (including imaging, microbiology, ancillary and laboratory) are listed below for reference.    Significant Diagnostic Studies: Dg Abd 1 View  12/25/2015  CLINICAL DATA:  Recent bowel obstruction. Persistent abdominal  distention EXAM: ABDOMEN - 1 VIEW COMPARISON:  December 24, 2015 and December 23, 2015 FINDINGS: Nasogastric tube tip and side port are in the distal stomach. There remain loops of dilated small bowel, stable. There is contrast in the colon, unchanged. 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 No free air is evident. However, there is air seen in the common bile duct. IMPRESSION: Nasogastric tube tip and side port in distal stomach. There remain loops of dilated bowel, suggesting residual degree of partial obstruction. Very little change from 1 day prior. No free air evident. There is air apparent  in the common bile duct. Question whether patient has had a previous sphincterotomy procedure. Electronically Signed   By: Bretta BangWilliam  Woodruff III M.D.   On: 12/25/2015 09:13   Dg Abd 1 View  12/23/2015  CLINICAL DATA:  NG tube placement EXAM: ABDOMEN - 1 VIEW COMPARISON:  12/23/2015 FINDINGS: Enteric tube tip and side port project over the expected location of the gastric antrum. There is persistent marked gas distention of multiple loops of small bowel with index loop of small bowel within in the left mid abdomen measuring approximately 7.2 cm in diameter. Nondiagnostic evaluation for pneumoperitoneum secondary to supine positioning and exclusion a lower thorax. No definite pneumatosis. Nondiagnostic evaluation for portal venous gas given exclusion of the right upper abdominal quadrant. Multiple phleboliths overlie the lower pelvis bilaterally. Otherwise, no definitive abnormal intra-abdominal calcifications. Post left total hip replacement. Degenerative change of the lower lumbar spine and right hip is suspected though incompletely evaluated. IMPRESSION: 1. Enteric tube tip and side port project over the expected location of the gastric antrum. 2. Findings worrisome for high-grade small-bowel obstruction. Electronically Signed   By: Simonne ComeJohn  Watts M.D.   On: 12/23/2015 18:58   Dg Abd 1 View  12/21/2015  CLINICAL DATA:   75 year old female scratches 75 year old female with history of small-bowel obstruction. Abdominal pain and distention with intermittent nausea and vomiting. EXAM: ABDOMEN - 1 VIEW COMPARISON:  12/19/2015. FINDINGS: Gas and stool are noted throughout the colon extending to the level of the distal rectum. In addition, there are multiple dilated loops of gas-filled small bowel measuring up to 5.4 cm in the central abdomen. No definite pneumoperitoneum on this single supine view the abdomen. Status post left hip arthroplasty. IMPRESSION: 1. Although there is evidence of persistent small bowel obstruction, at this point, this appears to be partial as there is now some gas and stool throughout the colon and rectum. Electronically Signed   By: Trudie Reedaniel  Entrikin M.D.   On: 12/21/2015 15:09   Ct Abdomen Pelvis W Contrast  12/18/2015  CLINICAL DATA:  Generalized abdominal pain since Monday, intermittent diarrhea, vomiting, hypertension, history diverticulitis EXAM: CT ABDOMEN AND PELVIS WITH CONTRAST TECHNIQUE: Multidetector CT imaging of the abdomen and pelvis was performed using the standard protocol following bolus administration of intravenous contrast. CONTRAST:  100mL ISOVUE-300 IOPAMIDOL (ISOVUE-300) INJECTION 61% IV. Dilute oral contrast. COMPARISON:  None FINDINGS: Dependent atelectasis at lung bases. Gallstones within gallbladder with question additional sludge layer dependently. Liver, spleen, pancreas, kidneys, and adrenal glands normal. Distended stomach and proximal small bowel loops with decompressed distal small bowel loops consistent with small bowel obstruction. Transition from dilated to nondilated small bowel occurs in RIGHT mid abdomen image 57 with question mild bowel wall thickening at site. Small bowel loops measure up to 5.6 cm diameter. Distal small bowel and colon decompressed. Sigmoid diverticulosis without evidence of diverticulitis. Several tiny foci of gas are seen adjacent to a small bowel  loop in the mid abdomen, all likely within lumen or potentially a few tiny small bowel diverticula. No definite free intraperitoneal air or free fluid. Scattered atherosclerotic calcifications. Beam hardening artifacts from LEFT hip prosthesis obscure portions of pelvis. Bladder decompressed. Uterus surgically absent without definite visualization of the ovaries. Appendix not seen. No mass, hernia, or acute osseous findings. IMPRESSION: Mid small bowel obstruction without definite evidence of perforation. Questionable mild bowel wall thickening at the site of obstruction, could be related to inflammation, stricture, mass not excluded. Electronically Signed   By: Angelyn PuntMark  Boles M.D.  On: 12/18/2015 19:52   Dg Abd 2 Views  12/23/2015  CLINICAL DATA:  Abdominal pain and known small bowel dilatation EXAM: ABDOMEN - 2 VIEW COMPARISON:  12/21/2015 FINDINGS: Scattered large and small bowel gas is noted. Multiple dilated loops of small bowel are again identified. Decubitus view shows no evidence of free air. No acute bony abnormality is noted. Left hip prosthesis is seen. IMPRESSION: Persistent small bowel dilatation consistent with a partial small bowel obstruction. Some colonic gas is seen similar to that noted on the prior exam. Electronically Signed   By: Alcide Clever M.D.   On: 12/23/2015 09:38   Dg Abd 2 Views  12/19/2015  CLINICAL DATA:  Bowel obstruction EXAM: ABDOMEN - 2 VIEW COMPARISON:  CT abdomen and pelvis December 18, 2015 FINDINGS: Supine and upright images obtained. There are multiple loops of dilated bowel with multiple air-fluid levels consistent with persistent obstruction. No free air. There is atelectatic change in the left lung base. 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 IMPRESSION: Evidence of small bowel obstruction.  No free air. These results will be called to the ordering clinician or representative by the Radiologist Assistant, and communication documented in the PACS or zVision  Dashboard. Electronically Signed   By: Bretta Bang III M.D.   On: 12/19/2015 14:54   Dg Abd Portable 1v  12/27/2015  CLINICAL DATA:  Pt c/o abd discomfort when laying flat. F/u SBO, hx diverticulitis EXAM: PORTABLE ABDOMEN - 1 VIEW COMPARISON:  12/25/2015 FINDINGS: The right upper quadrant of the abdomen is excluded. Nasogastric tube is no longer apparent but may be off the image. There is moderate dilatation of small bowel loops, increased in diameter since the prior study. There has been evacuation colonic contrast. Left hip arthroplasty. IMPRESSION: Some persistent component of small bowel obstruction. Electronically Signed   By: Norva Pavlov M.D.   On: 12/27/2015 11:10   Dg Abd Portable 1v-small Bowel Obstruction Protocol-initial, 8 Hr Delay  12/24/2015  CLINICAL DATA:  Small bowel obstruction. EXAM: PORTABLE ABDOMEN - 1 VIEW COMPARISON:  Radiographs 12/23/2015. FINDINGS: The NG tube tip is in the antropyloric region of the stomach. Interval decrease and small bowel dilatation and progression of contrast into the colon. Findings suggest a resolving small bowel obstruction. No free air. IMPRESSION: Resolving small bowel obstruction. Electronically Signed   By: Rudie Meyer M.D.   On: 12/24/2015 18:25    Microbiology: Recent Results (from the past 240 hour(s))  Urine culture     Status: None   Collection Time: 12/18/15  6:00 PM  Result Value Ref Range Status   Specimen Description URINE, CLEAN CATCH  Final   Special Requests NONE  Final   Culture   Final    NO GROWTH 1 DAY Performed at [facility]    Report Status 12/20/2015 FINAL  Final     Labs: Basic Metabolic Panel:  Recent Labs Lab 12/22/15 0518 12/24/15 0530 12/25/15 0527 12/28/15 0549  NA 142 143 141 144  K 4.1 4.1 3.9 3.7  CL 110 110 112* 112*  CO2 GLUCOSE 124* 93 81 109*  BUN <5* <5* 6 10  CREATININE 0.71 0.71 0.67 0.79  CALCIUM 8.6* 8.8* 8.4* 8.7*   Liver Function Tests:  Recent  Labs Lab 12/25/15 0527  AST 16  ALT 15  ALKPHOS 64  BILITOT 0.7  PROT 5.4*  ALBUMIN 2.8*   No results for input(s): LIPASE, AMYLASE in the last 168 hours.  No results for input(s): AMMONIA in the last 168 hours. CBC:  Recent Labs Lab 12/22/15 0518 12/24/15 0530  WBC 4.3 5.7  HGB 8.7* 8.8*  HCT 27.9* 28.0*  MCV 84.0 85.4  PLT 261 300   Cardiac Enzymes: No results for input(s): CKTOTAL, CKMB, CKMBINDEX, TROPONINI in the last 168 hours. BNP: BNP (last 3 results) No results for input(s): BNP in the last 8760 hours.  ProBNP (last 3 results) No results for input(s): PROBNP in the last 8760 hours.  CBG: No results for input(s): GLUCAP in the last 168 hours.     SignedCalvert Cantor, MD [facility] 12/28/2015, 1:52 PM

## 2015-12-28 NOTE — Progress Notes (Signed)
Pt left with her "friend". Alert, oriented, and without c/o. Discharge instructions given/explained with pt verbalizing understanding. Followup appointment noted.

## 2015-12-28 NOTE — Progress Notes (Signed)
[facility] Office:  [phone] General Surgery Progress Note   LOS: 10 days  POD -     Assessment/Plan: 1.  Partial SBO  Hx of perforated diverticulitis and prior hernia repair with mesh.  KUB yesterday still shows SB and colon gas.  Taking reg diet, passing a lot of gas.  Had small BM's. She is ready to go home. There is no reason for surgery follow up. I would suggest she see a gastroenterologist for a colonoscopy and further discussion about her bowels.  2.  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  It sounds like she has been doing this for 5 years.  2.  Hx of diverticulitis 3.  Hx of Migraines 2-6 per month now 4.  Hypertension 5.  DVT prophylaxis - Lovenox   Principal Problem:   SBO (small bowel obstruction) (HCC) Active Problems:   Migraine   Essential hypertension   Diverticulosis   Subjective:  Ready to go home.  Lives by self.  Discussed her Mg Citrate tabs that she takes, he "chronic" bowel issues.  Objective:   Filed Vitals:   12/27/15 2125 12/28/15 0520  BP: 134/93 126/89  Pulse: 87 85  Temp: 99.1 F (37.3 C) 98.6 F (37 C)  Resp:  18     Intake/Output from previous day:  04/22 0701 - 04/23 0700 In: 720 [P.O.:720] Out: -   Intake/Output this shift:      Physical Exam:   General: Older WF who is alert and oriented.    HEENT: Normal. Pupils equal. .   Lungs: Clear   Abdomen: Mild distention.  Has good BS.     Lab Results:   No results for input(s): WBC, HGB, HCT, PLT in the last 72 hours.  BMET    Recent Labs  12/28/15 0549  NA 144  K 3.7  CL 112*  CO2 23  GLUCOSE 109*  BUN 10  CREATININE 0.79  CALCIUM 8.7*    PT/INR  No results for input(s): LABPROT, INR in the last 72 hours.  ABG  No results for input(s): PHART, HCO3 in the last 72 hours.  Invalid input(s): PCO2, PO2   Studies/Results:  Dg Abd Portable  1v  12/27/2015  CLINICAL DATA:  Pt c/o abd discomfort when laying flat. F/u SBO, hx diverticulitis EXAM: PORTABLE ABDOMEN - 1 VIEW COMPARISON:  12/25/2015 FINDINGS: The right upper quadrant of the abdomen is excluded. Nasogastric tube is no longer apparent but may be off the image. There is moderate dilatation of small bowel loops, increased in diameter since the prior study. There has been evacuation colonic contrast. Left hip arthroplasty. IMPRESSION: Some persistent component of small bowel obstruction. Electronically Signed   By: Norva PavlovElizabeth  Brown M.D.   On: 12/27/2015 11:10     Anti-infectives:   Anti-infectives    None      Ovidio Kinavid Rian Busche, MD, FACS Pager: [phone] [facility] Office: [phone] 12/28/2015

## 2015-12-29 ENCOUNTER — Other Ambulatory Visit: Payer: Self-pay | Admitting: *Deleted

## 2015-12-29 NOTE — Patient Outreach (Signed)
[facility]) Care Management  12/29/2015  Melinda Washington 10/23/1940 [id number]  Transition of care  RN spoke in detail with this pt after introducing [facility] and the purpose for today's call. Pt quick to indicates she has a very supportive community of friends and a son who is a doctor that she can depend on for assistance. RN discussed the purpose for today's call concerning her blood pressure. Pt reports she is not on any prescriptive medications for her BP or cholesterol ("no statins"). States just takes medication for her headaches if needed. Pt States she has a BP device that she takes her BP with however readings are usually good other then the one event where it was elevated. Transition of care template completed as pt reports she is doing well. RN extended community for home visits to further discuss and education on her medical condition related to HTN however pt opt to decline. RN also discussed telephonic health coach for telephonic disease management possible in the future after transition of care calls however pt also decline. RN expressed how importance it was to monitor her HTN to prevent acute events from happening and offered ongoing transition of care calls over the new few weeks as a prevention measures to assist pt if needed during her ongoing recovery. Pt receptive to ongoing transition of care calls and allowed a scheduled appointment for next Monday. Will follow up accordingly with plan of care that was discussed for medical appointments, adherence to any prescribed medications over the next few weeks with her providers and ongoing monitoring to prevent risk of readmission. Pt receptive to this plan of care and goals that are now in place.  Melinda CousinLisa Rogena Deupree, RN Care Management Coordinator [facility] Main Office [phone]

## 2016-01-02 ENCOUNTER — Telehealth: Payer: Self-pay

## 2016-01-02 DIAGNOSIS — K56609 Unspecified intestinal obstruction, unspecified as to partial versus complete obstruction: Secondary | ICD-10-CM

## 2016-01-02 NOTE — Telephone Encounter (Signed)
Pt has just gotten out of the hosp and was released still having a partial small bowel obstru. She would like to have a referral to a GI specialist if needed, but isn't sure that her insurance even requires a referral. At my suggestion she is going to call [facility] and ask if they can set up an appt, or if she needs a referral. She feels much better, but is still having obvious problems and wants to try to get something set up today. I gave her Dr Marvell FullerGessner's name as a suggestion, but she would like to get Dr Cain SaupeL's recommendation of a really good GI with lots of experience and easy to talk to. If she needs a referral, she wants to know whether he can refer her w/out her coming in here first since she has been seen here for GI issues. She states she does not feel well enough to come in and wait to be seen and then go to the specialist too.

## 2016-01-04 NOTE — Telephone Encounter (Signed)
I can't believe she didn't have a GI consultant reviewing her case in the hospital.  Dr. Leone PayorGessner would be at the top of anyone's list of qualified GI doctors

## 2016-01-05 ENCOUNTER — Other Ambulatory Visit: Payer: Self-pay | Admitting: *Deleted

## 2016-01-05 NOTE — Patient Outreach (Signed)
[facility]) Care Management  01/05/2016  Melinda SawyersRosemarie W Washington 29-Mar-1941 [id number]  Transition of care (week 2)  RN spoke with pt today and inquired once again on her ongoing management of care. Pt states she is doing "a little better" but continues to recover. Reports she is unable to get an appointment with the GI provider until June and her providers office does not make appointments (walk-in clinic) only with a wait for about 1 1/2 hours which pt states she can not do. RN stress the importance of a post op follow up visit as a prevention measures (pt verbalized an understanding). Pt states she is not any new medications mostly supplements or as needed for most of her medicine. RN once again extend home visits for [facility] however pt remains receptive to telephonic follow up only at this time with no need for case management services. RN offered to schedule another transition of care call on next Monday as pt remains receptive. Will schedule and follow up accordingly.  Elliot CousinLisa Henretter Piekarski, RN Care Management Coordinator [facility] Main Office [phone]

## 2016-01-05 NOTE — Telephone Encounter (Signed)
I couldn't believe she didn't leave hosp with a referral appt set up with GI specialist for f/up w/in a few days! Pt CB and stated that she does not NEED a referral, but [place] can not see her until June. Do you think that if we referred her, or referred to another GI you would recommend that we could get her seen this week? Does she need to come in to see you in order to get the referral?

## 2016-01-06 NOTE — Telephone Encounter (Signed)
I put in the referral and called pt to notify.   Referrals, pt specified after I had put in the order that she prefers a female doctor, one that is experienced and easy to talk to. She can not do early morning appts, prefers afternoon or late morning. Thanks!

## 2016-01-06 NOTE — Telephone Encounter (Signed)
Sure.  Let's try a referral from our end

## 2016-01-07 ENCOUNTER — Telehealth: Payer: Self-pay

## 2016-01-07 NOTE — Telephone Encounter (Signed)
Pt LM on my VM saying that she could not get Amber at [facility] to understand that she can not possibly make it to an appt by 10:30 because she is on the commode all morning d/t her GI problems. She was near tears, but I just don't know what else to tell her other than maybe she could call Dr Kenna GilbertMann's office herself and they may not require notes then. Maybe they will have an afternoon appt sooner? Velna HatchetSheila, you didn't think that Dr Kenna GilbertMann's office would accept her without our OV notes correct?

## 2016-01-07 NOTE — Telephone Encounter (Signed)
Spoke with [facility] and they are willing to see patient on 01/09/16 at 10:30am with Dr. Madilyn FiremanHayes.

## 2016-01-07 NOTE — Telephone Encounter (Signed)
Just do your best.

## 2016-01-07 NOTE — Telephone Encounter (Signed)
Patient will therefore need to make her own arrangements.  I cannot do any more.

## 2016-01-07 NOTE — Telephone Encounter (Signed)
Please inform patient that I cannot do any better than what I have done to get her a gastroenterology consult.

## 2016-01-07 NOTE — Telephone Encounter (Signed)
Melinda Washington with [facility] called me back and stated patient declined the appointment because it's just too early in the morning. She wanted an appointment for the afternoon and they didn't have one, the first available appointment in the afternoon is the end of June 2017.  Pt stated that she's going to call Dr. Milus GlazierLauenstein and he'll make them see her later.

## 2016-01-07 NOTE — Telephone Encounter (Signed)
Dr. Leone PayorGessner is booked out til July now. A referral isn't going to get her in any earlier. I can send the notes [facility] to [facility] and Dr. Kenna GilbertMann's office but they'll want to review the notes before scheduling patient.

## 2016-01-08 NOTE — Telephone Encounter (Signed)
They will not schedule an appointment without provider review.  They require OV notes, which either Dr Loreta AveMann or Dr Elnoria HowardHung will review before they will accept a patient and schedule them.  Also, if she has been to another GI facility, [facility] will need her records from that facility; she will need to fill out a release in that case.

## 2016-01-08 NOTE — Telephone Encounter (Signed)
Please see notes on other ph message about this referral.

## 2016-01-08 NOTE — Telephone Encounter (Signed)
Tried to call pt to give her info below from [place]. LMOM on both #s.

## 2016-01-08 NOTE — Telephone Encounter (Signed)
Pt called back and she agreed to call Dr Loreta AveMann and Hung's office to see if she can get an appt w/out a referral. Advised pt that if she needs a referral I don't see any other alternative than to come here to be seen so that notes can be documented to send for the referral.

## 2016-01-12 ENCOUNTER — Encounter: Payer: Self-pay | Admitting: *Deleted

## 2016-01-12 ENCOUNTER — Other Ambulatory Visit: Payer: Self-pay | Admitting: *Deleted

## 2016-01-12 NOTE — Patient Outreach (Signed)
[facility] Care Management  01/12/2016  Melinda SawyersRosemarie W Washington 07/09/1941 [id number]   Transition of care  RN spoke with pt today and reintroduced [facility] and case manager. Pt reports she is doing well and receptive initially to the call. RN requested to ask a few question for the initial assessment due to pt having more time to response today on the call. Reviewed the plan of care and goal and inquired more on the assessment however pt opt to decline during the inquires and do not wish to respond to any additional inquires. Pt indicated she has a son who is a doctor and if she needed anything she has someone to follow up with at that time. Pt request to cease [facility] with [facility] at this time however requested to contact our services if needed in the future and was grateful for the calls and inquires thus far but feels she is able to manager her ongoing medical issues with no additional services from [facility]. RN has informed pt that her primary will be notified and strongly encouraged pt to follow up her providers for the pending GI consult as previously discussed on the last conversation last week. No other request or inquires at this time as RN will close this case as requested by this pt. Again pt opt to decline ongoing inquired on the initial assessment and to end services at this time.   Patient was recently discharged from hospital and all medications have been reviewed. Elliot CousinLisa Chevonne Bostrom, RN Care Management Coordinator [facility] Main Office [phone]

## 2016-01-23 ENCOUNTER — Ambulatory Visit (INDEPENDENT_AMBULATORY_CARE_PROVIDER_SITE_OTHER): Payer: Medicare Other | Admitting: Family Medicine

## 2016-01-23 VITALS — BP 146/92 | HR 78 | Temp 98.2°F | Resp 18 | Ht 63.5 in | Wt 143.8 lb

## 2016-01-23 DIAGNOSIS — Z79899 Other long term (current) drug therapy: Secondary | ICD-10-CM | POA: Diagnosis not present

## 2016-01-23 DIAGNOSIS — G43009 Migraine without aura, not intractable, without status migrainosus: Secondary | ICD-10-CM

## 2016-01-23 DIAGNOSIS — K5900 Constipation, unspecified: Secondary | ICD-10-CM

## 2016-01-23 DIAGNOSIS — K660 Peritoneal adhesions (postprocedural) (postinfection): Secondary | ICD-10-CM | POA: Diagnosis not present

## 2016-01-23 DIAGNOSIS — K5909 Other constipation: Secondary | ICD-10-CM

## 2016-01-23 DIAGNOSIS — Z8719 Personal history of other diseases of the digestive system: Secondary | ICD-10-CM | POA: Diagnosis not present

## 2016-01-23 LAB — LIPID PANEL
Cholesterol: 263 mg/dL — ABNORMAL HIGH (ref 125–200)
HDL: 71 mg/dL (ref >=46)
LDL Cholesterol: 161 mg/dL — ABNORMAL HIGH (ref <130)
Total CHOL/HDL Ratio: 3.7 Ratio (ref <=5.0)
Triglycerides: 154 mg/dL — ABNORMAL HIGH (ref <150)
VLDL: 31 mg/dL — ABNORMAL HIGH (ref <30)

## 2016-01-23 MED ORDER — SUMATRIPTAN SUCCINATE 100 MG PO TABS
100.0000 mg | ORAL_TABLET | ORAL | Status: AC | PRN
Start: 2016-01-23 — End: ?

## 2016-01-23 NOTE — Patient Instructions (Addendum)
Results for orders placed or performed during the hospital encounter of 12/18/15  Urine culture  Result Value Ref Range   Specimen Description URINE, CLEAN CATCH    Special Requests NONE    Culture      NO GROWTH 1 DAY Performed at [facility]    Report Status 12/20/2015 FINAL   CBC with Differential/Platelet  Result Value Ref Range   WBC 9.0 4.0 - 10.5 K/uL   RBC 3.76 (L) 3.87 - 5.11 MIL/uL   Hemoglobin 9.9 (L) 12.0 - 15.0 g/dL   HCT 40.0 (L) 86.7 - 61.9 %   MCV 83.8 78.0 - 100.0 fL   MCH 26.3 26.0 - 34.0 pg   MCHC 31.4 30.0 - 36.0 g/dL   RDW 50.9 (H) 32.6 - 71.2 %   Platelets 285 150 - 400 K/uL   Neutrophils Relative % 87 %   Neutro Abs 7.8 (H) 1.7 - 7.7 K/uL   Lymphocytes Relative 8 %   Lymphs Abs 0.7 0.7 - 4.0 K/uL   Monocytes Relative 5 %   Monocytes Absolute 0.5 0.1 - 1.0 K/uL   Eosinophils Relative 0 %   Eosinophils Absolute 0.0 0.0 - 0.7 K/uL   Basophils Relative 0 %   Basophils Absolute 0.0 0.0 - 0.1 K/uL  Comprehensive metabolic panel  Result Value Ref Range   Sodium 141 135 - 145 mmol/L   Potassium 3.6 3.5 - 5.1 mmol/L   Chloride 101 101 - 111 mmol/L   CO2 26 22 - 32 mmol/L   Glucose, Bld 120 (H) 65 - 99 mg/dL   BUN 33 (H) 6 - 20 mg/dL   Creatinine, Ser 4.58 0.44 - 1.00 mg/dL   Calcium 9.4 8.9 - 09.9 mg/dL   Total Protein 6.9 6.5 - 8.1 g/dL   Albumin 3.5 3.5 - 5.0 g/dL   AST 12 (L) 15 - 41 U/L   ALT 11 (L) 14 - 54 U/L   Alkaline Phosphatase 88 38 - 126 U/L   Total Bilirubin 1.4 (H) 0.3 - 1.2 mg/dL   GFR calc non Af Amer 56 (L) >60 mL/min   GFR calc Af Amer >60 >60 mL/min   Anion gap 14 5 - 15  Lipase, blood  Result Value Ref Range   Lipase 24 11 - 51 U/L  Urinalysis, Routine w reflex microscopic (not at [facility])  Result Value Ref Range   Color, Urine YELLOW YELLOW   APPearance CLOUDY (A) CLEAR   Specific Gravity, Urine 1.027 1.005 - 1.030   pH 8.0 5.0 - 8.0   Glucose, UA NEGATIVE NEGATIVE mg/dL   Hgb urine dipstick NEGATIVE NEGATIVE    Bilirubin Urine NEGATIVE NEGATIVE   Ketones, ur >80 (A) NEGATIVE mg/dL   Protein, ur 833 (A) NEGATIVE mg/dL   Nitrite NEGATIVE NEGATIVE   Leukocytes, UA TRACE (A) NEGATIVE  Urine microscopic-add on  Result Value Ref Range   Squamous Epithelial / LPF 0-5 (A) NONE SEEN   WBC, UA 0-5 0 - 5 WBC/hpf   RBC / HPF 0-5 0 - 5 RBC/hpf   Bacteria, UA RARE (A) NONE SEEN   Casts HYALINE CASTS (A) NEGATIVE  Magnesium  Result Value Ref Range   Magnesium 2.4 1.7 - 2.4 mg/dL  Phosphorus  Result Value Ref Range   Phosphorus 2.1 (L) 2.5 - 4.6 mg/dL  CBC WITH DIFFERENTIAL  Result Value Ref Range   WBC 4.7 4.0 - 10.5 K/uL   RBC 3.23 (L) 3.87 - 5.11 MIL/uL   Hemoglobin  8.4 (L) 12.0 - 15.0 g/dL   HCT 16.1 (L) 09.6 - 04.5 %   MCV 83.9 78.0 - 100.0 fL   MCH 26.0 26.0 - 34.0 pg   MCHC 31.0 30.0 - 36.0 g/dL   RDW 40.9 (H) 81.1 - 91.4 %   Platelets 240 150 - 400 K/uL   Neutrophils Relative % 69 %   Neutro Abs 3.2 1.7 - 7.7 K/uL   Lymphocytes Relative 7 %   Lymphs Abs 0.4 (L) 0.7 - 4.0 K/uL   Monocytes Relative 21 %   Monocytes Absolute 1.0 0.1 - 1.0 K/uL   Eosinophils Relative 3 %   Eosinophils Absolute 0.1 0.0 - 0.7 K/uL   Basophils Relative 0 %   Basophils Absolute 0.0 0.0 - 0.1 K/uL  Comprehensive metabolic panel  Result Value Ref Range   Sodium 144 135 - 145 mmol/L   Potassium 3.6 3.5 - 5.1 mmol/L   Chloride 108 101 - 111 mmol/L   CO2 29 22 - 32 mmol/L   Glucose, Bld 122 (H) 65 - 99 mg/dL   BUN 26 (H) 6 - 20 mg/dL   Creatinine, Ser 7.82 0.44 - 1.00 mg/dL   Calcium 8.5 (L) 8.9 - 10.3 mg/dL   Total Protein 5.7 (L) 6.5 - 8.1 g/dL   Albumin 2.8 (L) 3.5 - 5.0 g/dL   AST 11 (L) 15 - 41 U/L   ALT 9 (L) 14 - 54 U/L   Alkaline Phosphatase 73 38 - 126 U/L   Total Bilirubin 0.5 0.3 - 1.2 mg/dL   GFR calc non Af Amer >60 >60 mL/min   GFR calc Af Amer >60 >60 mL/min   Anion gap 7 5 - 15  CBC WITH DIFFERENTIAL  Result Value Ref Range   WBC 4.0 4.0 - 10.5 K/uL   RBC 3.59 (L) 3.87 - 5.11 MIL/uL    Hemoglobin 9.6 (L) 12.0 - 15.0 g/dL   HCT 95.6 (L) 21.3 - 08.6 %   MCV 86.4 78.0 - 100.0 fL   MCH 26.7 26.0 - 34.0 pg   MCHC 31.0 30.0 - 36.0 g/dL   RDW 57.8 (H) 46.9 - 62.9 %   Platelets 288 150 - 400 K/uL   Neutrophils Relative % 57 %   Neutro Abs 2.3 1.7 - 7.7 K/uL   Lymphocytes Relative 18 %   Lymphs Abs 0.7 0.7 - 4.0 K/uL   Monocytes Relative 21 %   Monocytes Absolute 0.8 0.1 - 1.0 K/uL   Eosinophils Relative 3 %   Eosinophils Absolute 0.1 0.0 - 0.7 K/uL   Basophils Relative 1 %   Basophils Absolute 0.0 0.0 - 0.1 K/uL  Comprehensive metabolic panel  Result Value Ref Range   Sodium 142 135 - 145 mmol/L   Potassium 4.2 3.5 - 5.1 mmol/L   Chloride 110 101 - 111 mmol/L   CO2 21 (L) 22 - 32 mmol/L   Glucose, Bld 70 65 - 99 mg/dL   BUN 15 6 - 20 mg/dL   Creatinine, Ser 5.28 0.44 - 1.00 mg/dL   Calcium 8.7 (L) 8.9 - 10.3 mg/dL   Total Protein 6.1 (L) 6.5 - 8.1 g/dL   Albumin 3.0 (L) 3.5 - 5.0 g/dL   AST 17 15 - 41 U/L   ALT 14 14 - 54 U/L   Alkaline Phosphatase 73 38 - 126 U/L   Total Bilirubin 0.9 0.3 - 1.2 mg/dL   GFR calc non Af Amer >60 >60 mL/min   GFR calc Af  Amer >60 >60 mL/min   Anion gap 11 5 - 15  CBC  Result Value Ref Range   WBC 4.3 4.0 - 10.5 K/uL   RBC 3.32 (L) 3.87 - 5.11 MIL/uL   Hemoglobin 8.7 (L) 12.0 - 15.0 g/dL   HCT 16.127.9 (L) 09.636.0 - 04.546.0 %   MCV 84.0 78.0 - 100.0 fL   MCH 26.2 26.0 - 34.0 pg   MCHC 31.2 30.0 - 36.0 g/dL   RDW 40.915.6 (H) 81.111.5 - 91.415.5 %   Platelets 261 150 - 400 K/uL  Basic metabolic panel  Result Value Ref Range   Sodium 142 135 - 145 mmol/L   Potassium 4.1 3.5 - 5.1 mmol/L   Chloride 110 101 - 111 mmol/L   CO2 24 22 - 32 mmol/L   Glucose, Bld 124 (H) 65 - 99 mg/dL   BUN <5 (L) 6 - 20 mg/dL   Creatinine, Ser 7.820.71 0.44 - 1.00 mg/dL   Calcium 8.6 (L) 8.9 - 10.3 mg/dL   GFR calc non Af Amer >60 >60 mL/min   GFR calc Af Amer >60 >60 mL/min   Anion gap 8 5 - 15  CBC  Result Value Ref Range   WBC 5.7 4.0 - 10.5 K/uL   RBC 3.28  (L) 3.87 - 5.11 MIL/uL   Hemoglobin 8.8 (L) 12.0 - 15.0 g/dL   HCT 95.628.0 (L) 21.336.0 - 08.646.0 %   MCV 85.4 78.0 - 100.0 fL   MCH 26.8 26.0 - 34.0 pg   MCHC 31.4 30.0 - 36.0 g/dL   RDW 57.816.3 (H) 46.911.5 - 62.915.5 %   Platelets 300 150 - 400 K/uL  Basic metabolic panel  Result Value Ref Range   Sodium 143 135 - 145 mmol/L   Potassium 4.1 3.5 - 5.1 mmol/L   Chloride 110 101 - 111 mmol/L   CO2 25 22 - 32 mmol/L   Glucose, Bld 93 65 - 99 mg/dL   BUN <5 (L) 6 - 20 mg/dL   Creatinine, Ser 5.280.71 0.44 - 1.00 mg/dL   Calcium 8.8 (L) 8.9 - 10.3 mg/dL   GFR calc non Af Amer >60 >60 mL/min   GFR calc Af Amer >60 >60 mL/min   Anion gap 8 5 - 15  Basic metabolic panel  Result Value Ref Range   Sodium 141 135 - 145 mmol/L   Potassium 3.9 3.5 - 5.1 mmol/L   Chloride 112 (H) 101 - 111 mmol/L   CO2 22 22 - 32 mmol/L   Glucose, Bld 81 65 - 99 mg/dL   BUN 6 6 - 20 mg/dL   Creatinine, Ser 4.130.67 0.44 - 1.00 mg/dL   Calcium 8.4 (L) 8.9 - 10.3 mg/dL   GFR calc non Af Amer >60 >60 mL/min   GFR calc Af Amer >60 >60 mL/min   Anion gap 7 5 - 15  Hepatic function panel  Result Value Ref Range   Total Protein 5.4 (L) 6.5 - 8.1 g/dL   Albumin 2.8 (L) 3.5 - 5.0 g/dL   AST 16 15 - 41 U/L   ALT 15 14 - 54 U/L   Alkaline Phosphatase 64 38 - 126 U/L   Total Bilirubin 0.7 0.3 - 1.2 mg/dL   Bilirubin, Direct <2.4<0.1 (L) 0.1 - 0.5 mg/dL   Indirect Bilirubin NOT CALCULATED 0.3 - 0.9 mg/dL  Basic metabolic panel  Result Value Ref Range   Sodium 144 135 - 145 mmol/L   Potassium 3.7 3.5 - 5.1 mmol/L  Chloride 112 (H) 101 - 111 mmol/L   CO2 23 22 - 32 mmol/L   Glucose, Bld 109 (H) 65 - 99 mg/dL   BUN 10 6 - 20 mg/dL   Creatinine, Ser 1.61 0.44 - 1.00 mg/dL   Calcium 8.7 (L) 8.9 - 10.3 mg/dL   GFR calc non Af Amer >60 >60 mL/min   GFR calc Af Amer >60 >60 mL/min   Anion gap 9 5 - 15   [place]

## 2016-01-23 NOTE — Progress Notes (Signed)
75 yo woman with recent small bowel obstruction (April).  She was attended by Dr. Ovidio Kin, did not eat for 10 days, and was given Miralax which caused heartburn.  She was "discharged still obstructed." She was felt to have adhesions.  Discharged April 24th.  No guidelines on diet have been provided.  She needs to see a gastroenterologist.  She tried to get an appointment with Deboraha Sprang but turned down an appointment because it was "too early in the  Morning" at 10:30 am and then couldn't get an appt with [place] until June.  She has called Dr. Loreta Ave who told her no longer sees Medicare patients.  Currently she is somewhat bloated, and has discomfort after eating.  Eating soft diet.    Current Outpatient Prescriptions on File Prior to Visit  Medication Sig Dispense Refill  . Ascorbic Acid (VITAMIN C) 1000 MG tablet Take 4,000 mg by mouth 2 (two) times daily.     . B Complex Vitamins (VITAMIN-B COMPLEX) TABS Take 1 tablet by mouth daily.     . Cholecalciferol (VITAMIN D3) 1000 UNITS CAPS Take 4,000 Units by mouth daily.    Marland Kitchen DIGESTIVE ENZYMES PO Take 1 capsule by mouth 2 (two) times daily with a meal.     . MAGNESIUM CITRATE PO Take 1,200 mg by mouth daily.    . Menaquinone-7 (VITAMIN K2 PO) Take 80 mcg by mouth daily.    . polyethylene glycol (MIRALAX / GLYCOLAX) packet Take 17 g by mouth daily as needed (no BM for 24 hours). 14 each 0  . Probiotic Product (PROBIOTIC DAILY PO) Take 1 capsule by mouth daily.     . SUMAtriptan (IMITREX) 100 MG tablet Take 1 tablet (100 mg total) by mouth every 2 (two) hours as needed for migraine. May take one tablet on the onset of headache. Then repeat in 2 hours as needed. Maximum 2 doses in 24 hours    . Turmeric POWD Take 1,000 mg by mouth daily. Reported on 01/12/2016     No current facility-administered medications on file prior to visit.     Objective:  BP 146/92 mmHg  Pulse 78  Temp(Src) 98.2 F (36.8 C) (Oral)  Resp 18  Ht 5' 3.5" (1.613 m)  Wt 143  lb 12.8 oz (65.227 kg)  BMI 25.07 kg/m2  SpO2 97%  LMP 04/04/2012 Wt Readings from Last 3 Encounters:  01/23/16 143 lb 12.8 oz (65.227 kg)  12/18/15 145 lb 11.2 oz (66.089 kg)  08/19/15 145 lb (65.772 kg)   Results for orders placed or performed during the hospital encounter of 12/18/15  Urine culture  Result Value Ref Range   Specimen Description URINE, CLEAN CATCH    Special Requests NONE    Culture      NO GROWTH 1 DAY Performed at [facility]    Report Status 12/20/2015 FINAL   CBC with Differential/Platelet  Result Value Ref Range   WBC 9.0 4.0 - 10.5 K/uL   RBC 3.76 (L) 3.87 - 5.11 MIL/uL   Hemoglobin 9.9 (L) 12.0 - 15.0 g/dL   HCT 13.0 (L) 86.5 - 78.4 %   MCV 83.8 78.0 - 100.0 fL   MCH 26.3 26.0 - 34.0 pg   MCHC 31.4 30.0 - 36.0 g/dL   RDW 69.6 (H) 29.5 - 28.4 %   Platelets 285 150 - 400 K/uL   Neutrophils Relative % 87 %   Neutro Abs 7.8 (H) 1.7 - 7.7 K/uL   Lymphocytes Relative 8 %  Lymphs Abs 0.7 0.7 - 4.0 K/uL   Monocytes Relative 5 %   Monocytes Absolute 0.5 0.1 - 1.0 K/uL   Eosinophils Relative 0 %   Eosinophils Absolute 0.0 0.0 - 0.7 K/uL   Basophils Relative 0 %   Basophils Absolute 0.0 0.0 - 0.1 K/uL  Comprehensive metabolic panel  Result Value Ref Range   Sodium 141 135 - 145 mmol/L   Potassium 3.6 3.5 - 5.1 mmol/L   Chloride 101 101 - 111 mmol/L   CO2 26 22 - 32 mmol/L   Glucose, Bld 120 (H) 65 - 99 mg/dL   BUN 33 (H) 6 - 20 mg/dL   Creatinine, Ser 1.61 0.44 - 1.00 mg/dL   Calcium 9.4 8.9 - 09.6 mg/dL   Total Protein 6.9 6.5 - 8.1 g/dL   Albumin 3.5 3.5 - 5.0 g/dL   AST 12 (L) 15 - 41 U/L   ALT 11 (L) 14 - 54 U/L   Alkaline Phosphatase 88 38 - 126 U/L   Total Bilirubin 1.4 (H) 0.3 - 1.2 mg/dL   GFR calc non Af Amer 56 (L) >60 mL/min   GFR calc Af Amer >60 >60 mL/min   Anion gap 14 5 - 15  Lipase, blood  Result Value Ref Range   Lipase 24 11 - 51 U/L  Urinalysis, Routine w reflex microscopic (not at [facility])  Result Value Ref Range    Color, Urine YELLOW YELLOW   APPearance CLOUDY (A) CLEAR   Specific Gravity, Urine 1.027 1.005 - 1.030   pH 8.0 5.0 - 8.0   Glucose, UA NEGATIVE NEGATIVE mg/dL   Hgb urine dipstick NEGATIVE NEGATIVE   Bilirubin Urine NEGATIVE NEGATIVE   Ketones, ur >80 (A) NEGATIVE mg/dL   Protein, ur 045 (A) NEGATIVE mg/dL   Nitrite NEGATIVE NEGATIVE   Leukocytes, UA TRACE (A) NEGATIVE  Urine microscopic-add on  Result Value Ref Range   Squamous Epithelial / LPF 0-5 (A) NONE SEEN   WBC, UA 0-5 0 - 5 WBC/hpf   RBC / HPF 0-5 0 - 5 RBC/hpf   Bacteria, UA RARE (A) NONE SEEN   Casts HYALINE CASTS (A) NEGATIVE  Magnesium  Result Value Ref Range   Magnesium 2.4 1.7 - 2.4 mg/dL  Phosphorus  Result Value Ref Range   Phosphorus 2.1 (L) 2.5 - 4.6 mg/dL  CBC WITH DIFFERENTIAL  Result Value Ref Range   WBC 4.7 4.0 - 10.5 K/uL   RBC 3.23 (L) 3.87 - 5.11 MIL/uL   Hemoglobin 8.4 (L) 12.0 - 15.0 g/dL   HCT 40.9 (L) 81.1 - 91.4 %   MCV 83.9 78.0 - 100.0 fL   MCH 26.0 26.0 - 34.0 pg   MCHC 31.0 30.0 - 36.0 g/dL   RDW 78.2 (H) 95.6 - 21.3 %   Platelets 240 150 - 400 K/uL   Neutrophils Relative % 69 %   Neutro Abs 3.2 1.7 - 7.7 K/uL   Lymphocytes Relative 7 %   Lymphs Abs 0.4 (L) 0.7 - 4.0 K/uL   Monocytes Relative 21 %   Monocytes Absolute 1.0 0.1 - 1.0 K/uL   Eosinophils Relative 3 %   Eosinophils Absolute 0.1 0.0 - 0.7 K/uL   Basophils Relative 0 %   Basophils Absolute 0.0 0.0 - 0.1 K/uL  Comprehensive metabolic panel  Result Value Ref Range   Sodium 144 135 - 145 mmol/L   Potassium 3.6 3.5 - 5.1 mmol/L   Chloride 108 101 - 111 mmol/L   CO2  29 22 - 32 mmol/L   Glucose, Bld 122 (H) 65 - 99 mg/dL   BUN 26 (H) 6 - 20 mg/dL   Creatinine, Ser 6.210.84 0.44 - 1.00 mg/dL   Calcium 8.5 (L) 8.9 - 10.3 mg/dL   Total Protein 5.7 (L) 6.5 - 8.1 g/dL   Albumin 2.8 (L) 3.5 - 5.0 g/dL   AST 11 (L) 15 - 41 U/L   ALT 9 (L) 14 - 54 U/L   Alkaline Phosphatase 73 38 - 126 U/L   Total Bilirubin 0.5 0.3 - 1.2  mg/dL   GFR calc non Af Amer >60 >60 mL/min   GFR calc Af Amer >60 >60 mL/min   Anion gap 7 5 - 15  CBC WITH DIFFERENTIAL  Result Value Ref Range   WBC 4.0 4.0 - 10.5 K/uL   RBC 3.59 (L) 3.87 - 5.11 MIL/uL   Hemoglobin 9.6 (L) 12.0 - 15.0 g/dL   HCT 30.831.0 (L) 65.736.0 - 84.646.0 %   MCV 86.4 78.0 - 100.0 fL   MCH 26.7 26.0 - 34.0 pg   MCHC 31.0 30.0 - 36.0 g/dL   RDW 96.215.8 (H) 95.211.5 - 84.115.5 %   Platelets 288 150 - 400 K/uL   Neutrophils Relative % 57 %   Neutro Abs 2.3 1.7 - 7.7 K/uL   Lymphocytes Relative 18 %   Lymphs Abs 0.7 0.7 - 4.0 K/uL   Monocytes Relative 21 %   Monocytes Absolute 0.8 0.1 - 1.0 K/uL   Eosinophils Relative 3 %   Eosinophils Absolute 0.1 0.0 - 0.7 K/uL   Basophils Relative 1 %   Basophils Absolute 0.0 0.0 - 0.1 K/uL  Comprehensive metabolic panel  Result Value Ref Range   Sodium 142 135 - 145 mmol/L   Potassium 4.2 3.5 - 5.1 mmol/L   Chloride 110 101 - 111 mmol/L   CO2 21 (L) 22 - 32 mmol/L   Glucose, Bld 70 65 - 99 mg/dL   BUN 15 6 - 20 mg/dL   Creatinine, Ser 3.240.75 0.44 - 1.00 mg/dL   Calcium 8.7 (L) 8.9 - 10.3 mg/dL   Total Protein 6.1 (L) 6.5 - 8.1 g/dL   Albumin 3.0 (L) 3.5 - 5.0 g/dL   AST 17 15 - 41 U/L   ALT 14 14 - 54 U/L   Alkaline Phosphatase 73 38 - 126 U/L   Total Bilirubin 0.9 0.3 - 1.2 mg/dL   GFR calc non Af Amer >60 >60 mL/min   GFR calc Af Amer >60 >60 mL/min   Anion gap 11 5 - 15  CBC  Result Value Ref Range   WBC 4.3 4.0 - 10.5 K/uL   RBC 3.32 (L) 3.87 - 5.11 MIL/uL   Hemoglobin 8.7 (L) 12.0 - 15.0 g/dL   HCT 40.127.9 (L) 02.736.0 - 25.346.0 %   MCV 84.0 78.0 - 100.0 fL   MCH 26.2 26.0 - 34.0 pg   MCHC 31.2 30.0 - 36.0 g/dL   RDW 66.415.6 (H) 40.311.5 - 47.415.5 %   Platelets 261 150 - 400 K/uL  Basic metabolic panel  Result Value Ref Range   Sodium 142 135 - 145 mmol/L   Potassium 4.1 3.5 - 5.1 mmol/L   Chloride 110 101 - 111 mmol/L   CO2 24 22 - 32 mmol/L   Glucose, Bld 124 (H) 65 - 99 mg/dL   BUN <5 (L) 6 - 20 mg/dL   Creatinine, Ser 2.590.71 0.44 -  1.00 mg/dL   Calcium 8.6 (  L) 8.9 - 10.3 mg/dL   GFR calc non Af Amer >60 >60 mL/min   GFR calc Af Amer >60 >60 mL/min   Anion gap 8 5 - 15  CBC  Result Value Ref Range   WBC 5.7 4.0 - 10.5 K/uL   RBC 3.28 (L) 3.87 - 5.11 MIL/uL   Hemoglobin 8.8 (L) 12.0 - 15.0 g/dL   HCT 40.9 (L) 81.1 - 91.4 %   MCV 85.4 78.0 - 100.0 fL   MCH 26.8 26.0 - 34.0 pg   MCHC 31.4 30.0 - 36.0 g/dL   RDW 78.2 (H) 95.6 - 21.3 %   Platelets 300 150 - 400 K/uL  Basic metabolic panel  Result Value Ref Range   Sodium 143 135 - 145 mmol/L   Potassium 4.1 3.5 - 5.1 mmol/L   Chloride 110 101 - 111 mmol/L   CO2 25 22 - 32 mmol/L   Glucose, Bld 93 65 - 99 mg/dL   BUN <5 (L) 6 - 20 mg/dL   Creatinine, Ser 0.86 0.44 - 1.00 mg/dL   Calcium 8.8 (L) 8.9 - 10.3 mg/dL   GFR calc non Af Amer >60 >60 mL/min   GFR calc Af Amer >60 >60 mL/min   Anion gap 8 5 - 15  Basic metabolic panel  Result Value Ref Range   Sodium 141 135 - 145 mmol/L   Potassium 3.9 3.5 - 5.1 mmol/L   Chloride 112 (H) 101 - 111 mmol/L   CO2 22 22 - 32 mmol/L   Glucose, Bld 81 65 - 99 mg/dL   BUN 6 6 - 20 mg/dL   Creatinine, Ser 5.78 0.44 - 1.00 mg/dL   Calcium 8.4 (L) 8.9 - 10.3 mg/dL   GFR calc non Af Amer >60 >60 mL/min   GFR calc Af Amer >60 >60 mL/min   Anion gap 7 5 - 15  Hepatic function panel  Result Value Ref Range   Total Protein 5.4 (L) 6.5 - 8.1 g/dL   Albumin 2.8 (L) 3.5 - 5.0 g/dL   AST 16 15 - 41 U/L   ALT 15 14 - 54 U/L   Alkaline Phosphatase 64 38 - 126 U/L   Total Bilirubin 0.7 0.3 - 1.2 mg/dL   Bilirubin, Direct <4.6 (L) 0.1 - 0.5 mg/dL   Indirect Bilirubin NOT CALCULATED 0.3 - 0.9 mg/dL  Basic metabolic panel  Result Value Ref Range   Sodium 144 135 - 145 mmol/L   Potassium 3.7 3.5 - 5.1 mmol/L   Chloride 112 (H) 101 - 111 mmol/L   CO2 23 22 - 32 mmol/L   Glucose, Bld 109 (H) 65 - 99 mg/dL   BUN 10 6 - 20 mg/dL   Creatinine, Ser 9.62 0.44 - 1.00 mg/dL   Calcium 8.7 (L) 8.9 - 10.3 mg/dL   GFR calc non Af Amer  >60 >60 mL/min   GFR calc Af Amer >60 >60 mL/min   Anion gap 9 5 - 15   Patient is angry about not being able to be seen by GI in [facility]  Abdomen:  Soft, nontender, hyperactive BS, no HSM, no masses  Assessment:  Adhesions seems to be a reasonable vs. Chronic constipation treated with ongoing magnesium citrate x 5 years (originally taken for migraines)  Plan:  Refer to GI in [place]  Elvina Sidle, MD

## 2016-01-26 ENCOUNTER — Telehealth: Payer: Self-pay

## 2016-01-26 NOTE — Telephone Encounter (Signed)
Pt called and LM on my VM that she has reconsidered having a GI referral in [place] because she assumes she will be making multiple trips and hates to drive back and forth. She stated that [place] would be OK. I checked with Shawna OrleansMelanie who verified that [facility] does have a GI and will refer pt there. Dr Elbert EwingsL, Lorain ChildesFYI. Notified pt I received her message and Referrals is going to change it.

## 2016-01-28 DIAGNOSIS — R1010 Upper abdominal pain, unspecified: Secondary | ICD-10-CM | POA: Diagnosis not present

## 2016-01-28 DIAGNOSIS — K660 Peritoneal adhesions (postprocedural) (postinfection): Secondary | ICD-10-CM | POA: Diagnosis not present

## 2016-01-28 DIAGNOSIS — K625 Hemorrhage of anus and rectum: Secondary | ICD-10-CM | POA: Diagnosis not present

## 2016-01-28 DIAGNOSIS — Z8719 Personal history of other diseases of the digestive system: Secondary | ICD-10-CM | POA: Diagnosis not present

## 2016-01-28 DIAGNOSIS — R140 Abdominal distension (gaseous): Secondary | ICD-10-CM | POA: Diagnosis not present

## 2016-02-25 DIAGNOSIS — Z96642 Presence of left artificial hip joint: Secondary | ICD-10-CM | POA: Diagnosis not present

## 2016-02-25 DIAGNOSIS — Z471 Aftercare following joint replacement surgery: Secondary | ICD-10-CM | POA: Diagnosis not present

## 2016-02-25 DIAGNOSIS — M1611 Unilateral primary osteoarthritis, right hip: Secondary | ICD-10-CM | POA: Diagnosis not present

## 2016-10-08 DIAGNOSIS — E2839 Other primary ovarian failure: Secondary | ICD-10-CM | POA: Diagnosis not present

## 2016-10-08 DIAGNOSIS — E663 Overweight: Secondary | ICD-10-CM | POA: Diagnosis not present

## 2016-10-08 DIAGNOSIS — E2749 Other adrenocortical insufficiency: Secondary | ICD-10-CM | POA: Diagnosis not present

## 2016-10-08 DIAGNOSIS — E038 Other specified hypothyroidism: Secondary | ICD-10-CM | POA: Diagnosis not present

## 2017-03-04 IMAGING — DX DG ABDOMEN 1V
1 series · 1 of 1 positions shown · non-contrast
Comparison: 12/23/2015

CLINICAL DATA: NG tube placement

EXAM:
ABDOMEN - 1 VIEW

[abdomen kub]
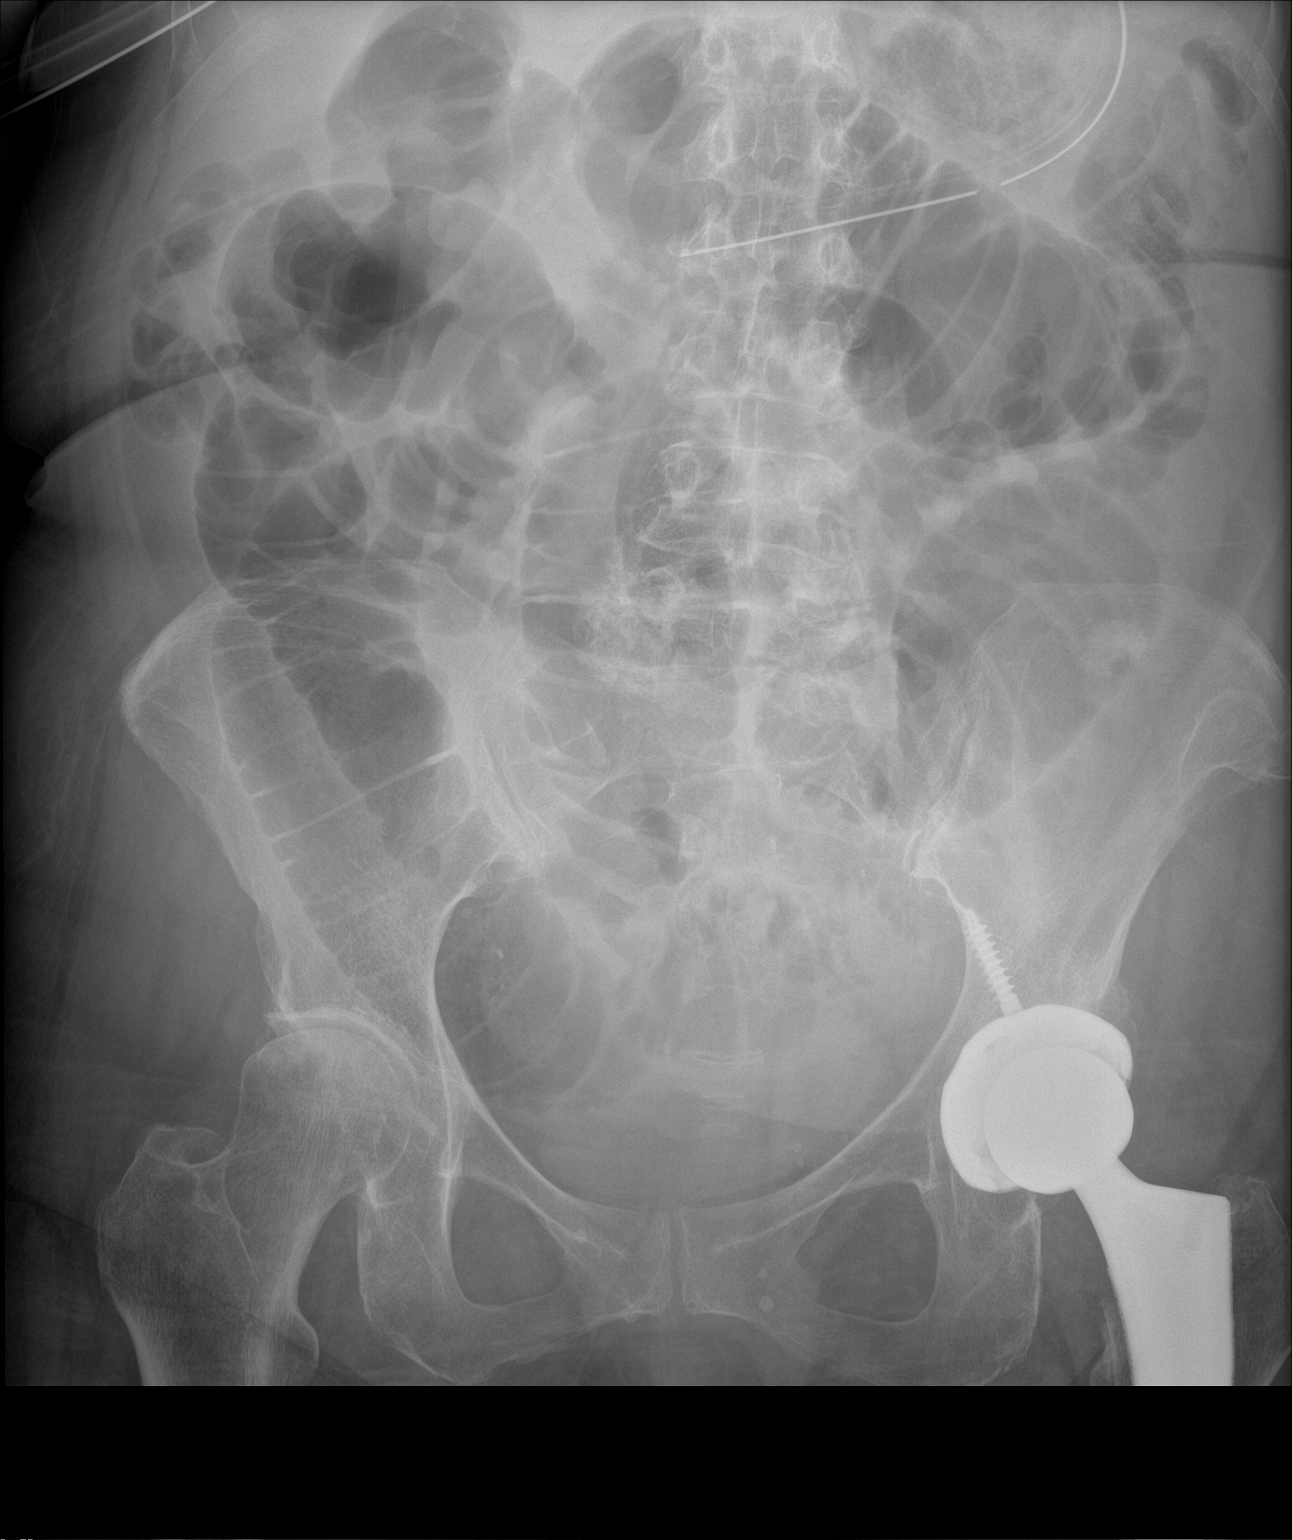

[1 of 1 positions shown; findings below may reference images not displayed]

FINDINGS: Enteric tube tip and side port project over the expected location of
the gastric antrum.

There is persistent marked gas distention of multiple loops of small
bowel with index loop of small bowel within in the left mid abdomen
measuring approximately 7.2 cm in diameter.

Nondiagnostic evaluation for pneumoperitoneum secondary to supine
positioning and exclusion a lower thorax. No definite pneumatosis.
Nondiagnostic evaluation for portal venous gas given exclusion of
the right upper abdominal quadrant.

Multiple phleboliths overlie the lower pelvis bilaterally.
Otherwise, no definitive abnormal intra-abdominal calcifications.

Post left total hip replacement. Degenerative change of the lower
lumbar spine and right hip is suspected though incompletely
evaluated.
IMPRESSION: 1. Enteric tube tip and side port project over the expected location
of the gastric antrum.
2. Findings worrisome for high-grade small-bowel obstruction.

## 2017-03-04 IMAGING — DX DG ABDOMEN 2V
2 series · 2 of 2 positions shown · non-contrast
Comparison: 12/21/2015

CLINICAL DATA: Abdominal pain and known small bowel dilatation

EXAM:
ABDOMEN - 2 VIEW

[abdomen supine]
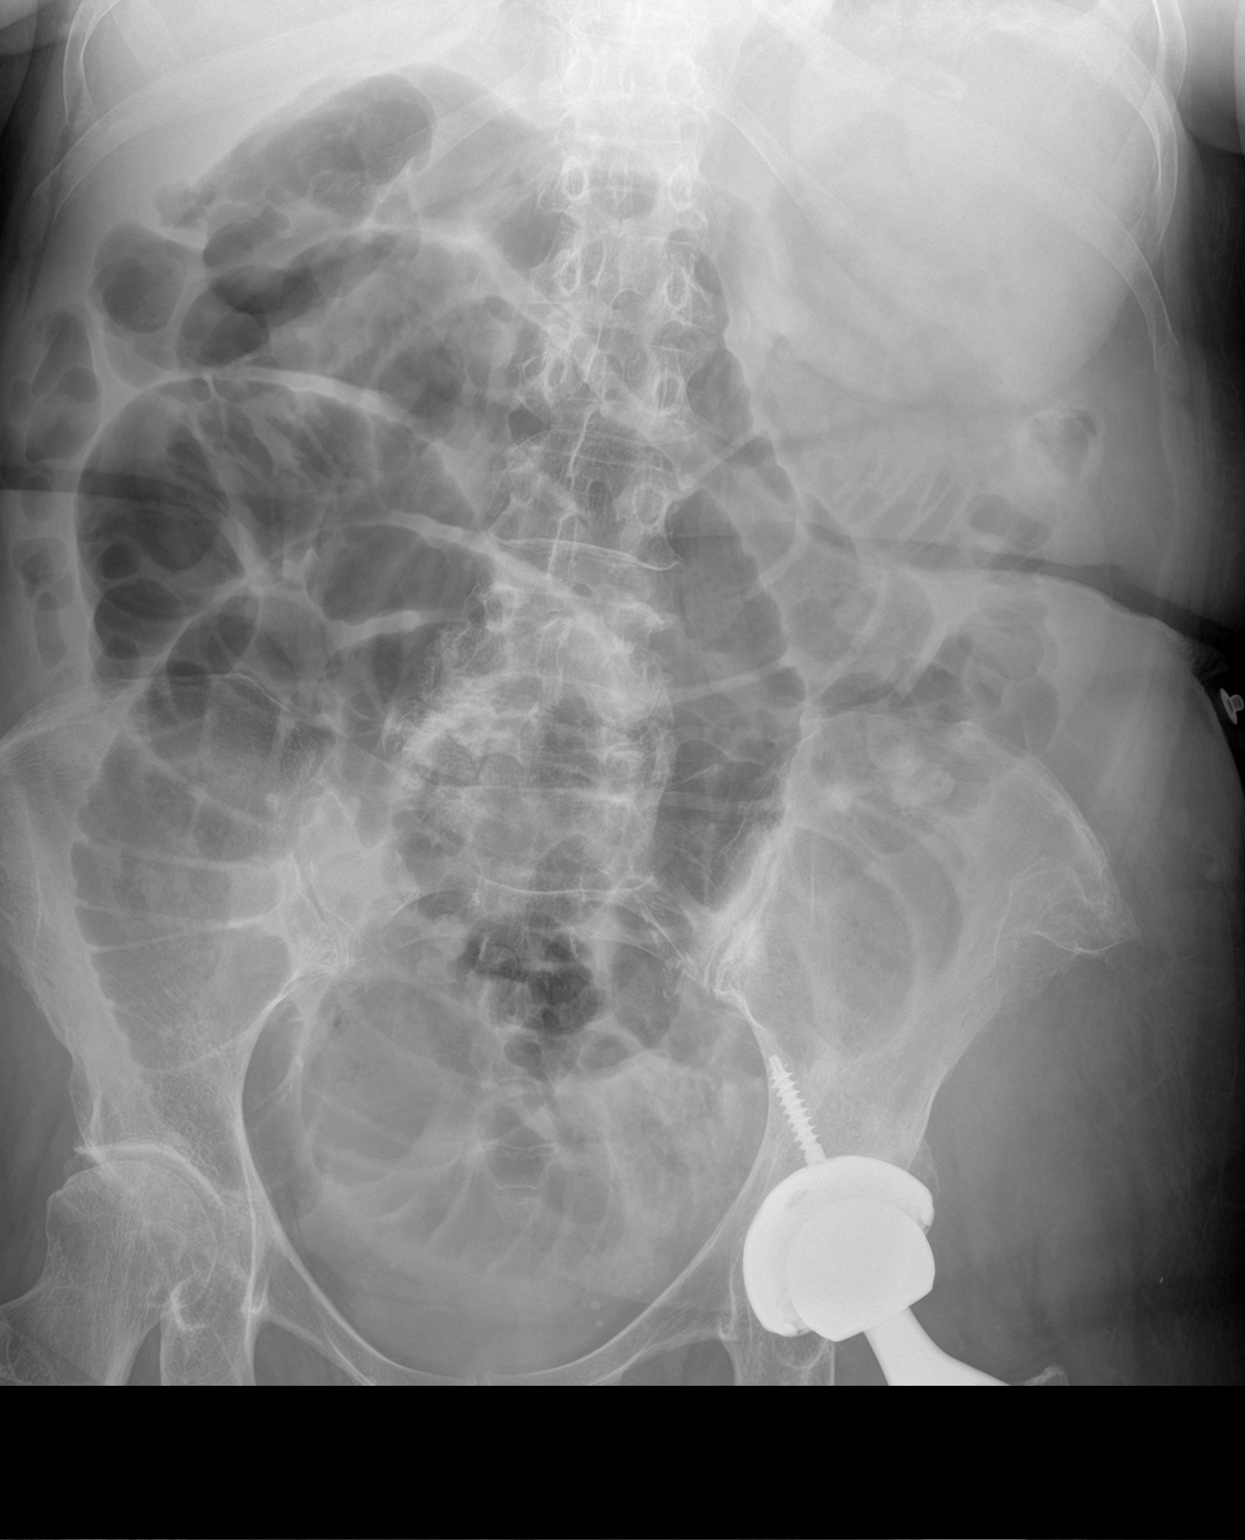

[abdomen decu]
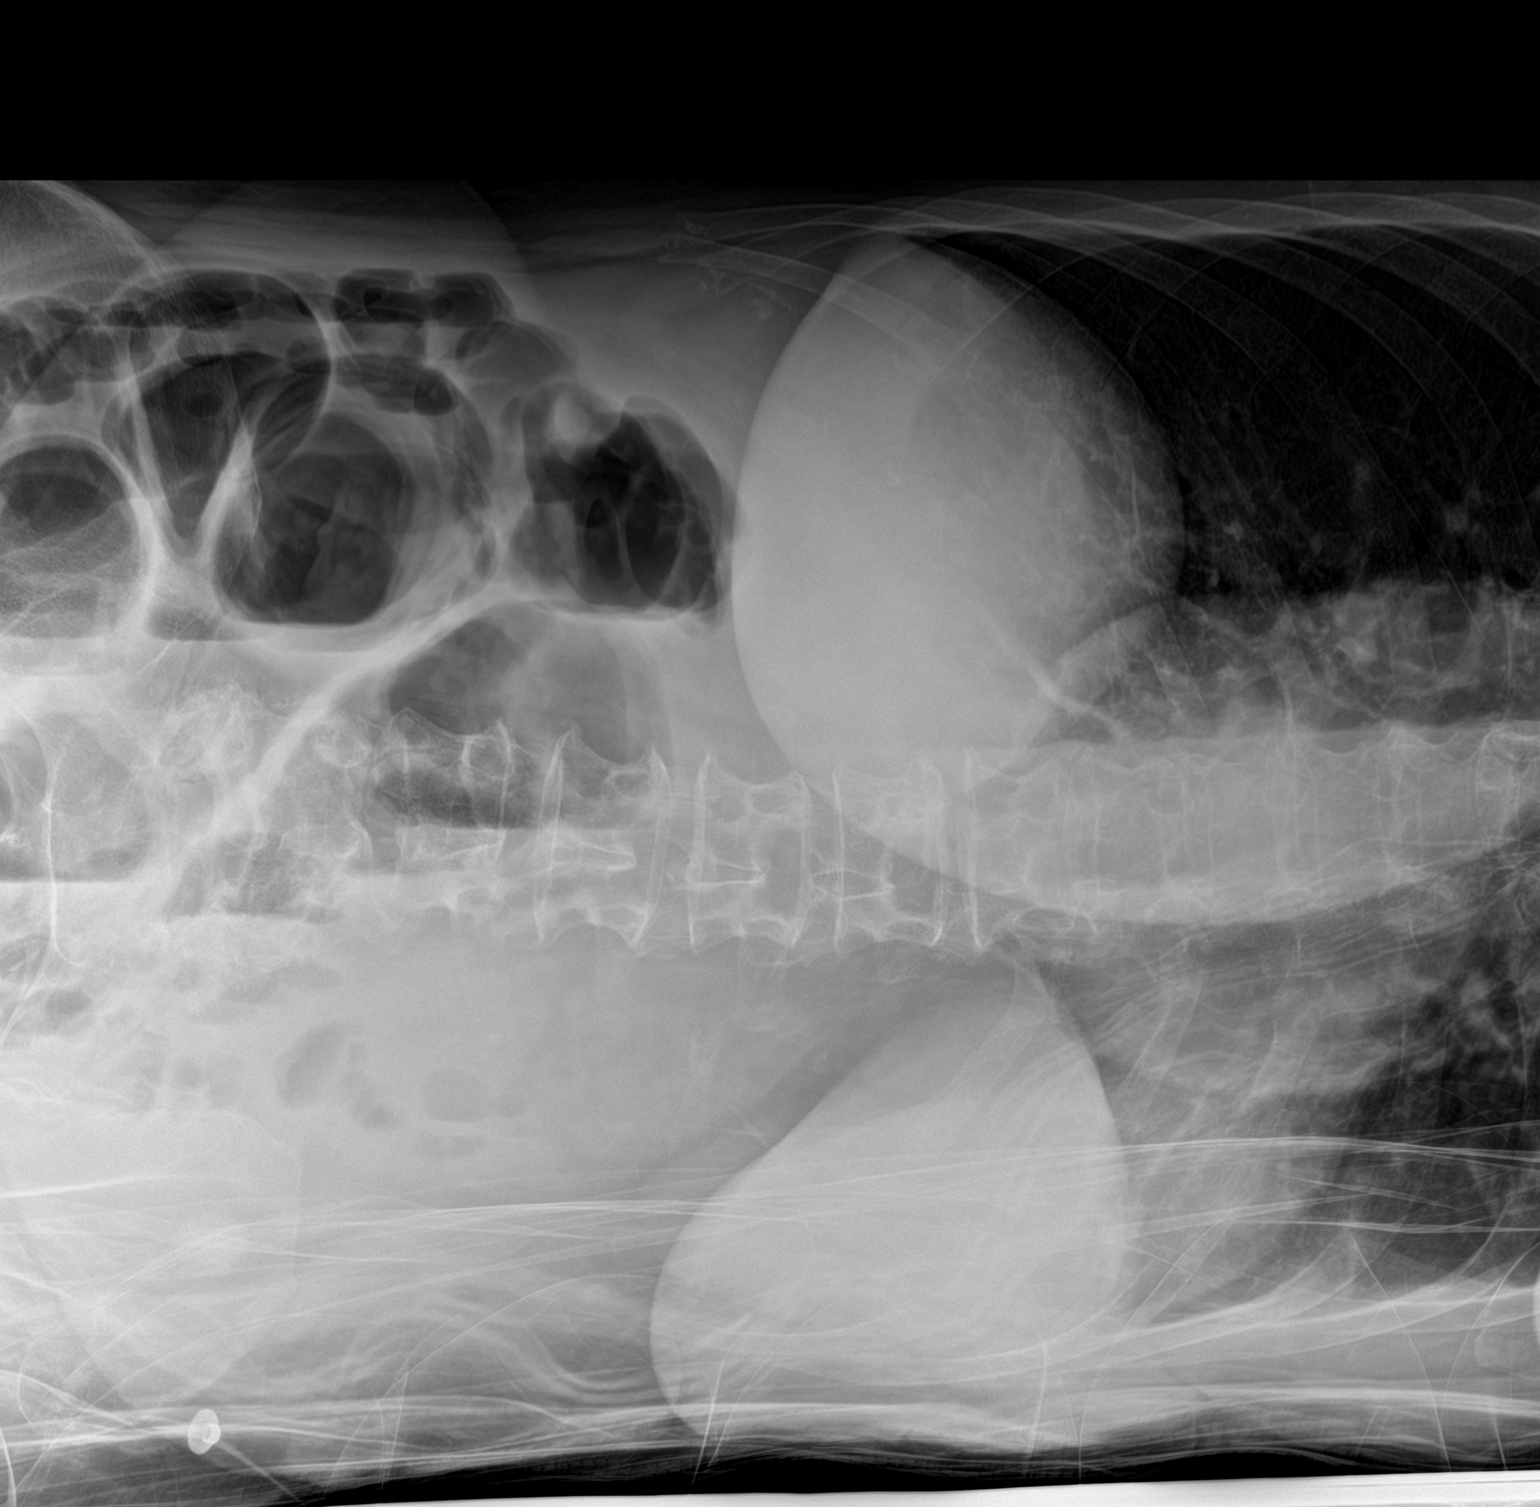

[2 of 2 positions shown; findings below may reference images not displayed]

FINDINGS: Scattered large and small bowel gas is noted. Multiple dilated loops
of small bowel are again identified. Decubitus view shows no
evidence of free air. No acute bony abnormality is noted. Left hip
prosthesis is seen.
IMPRESSION: Persistent small bowel dilatation consistent with a partial small
bowel obstruction. Some colonic gas is seen similar to that noted on
the prior exam.

## 2017-03-05 IMAGING — DX DG ABD PORTABLE 1V
1 series · 1 of 1 positions shown · non-contrast
Comparison: Radiographs 12/23/2015.

CLINICAL DATA: Small bowel obstruction.

EXAM:
PORTABLE ABDOMEN - 1 VIEW

[abdomen kub]
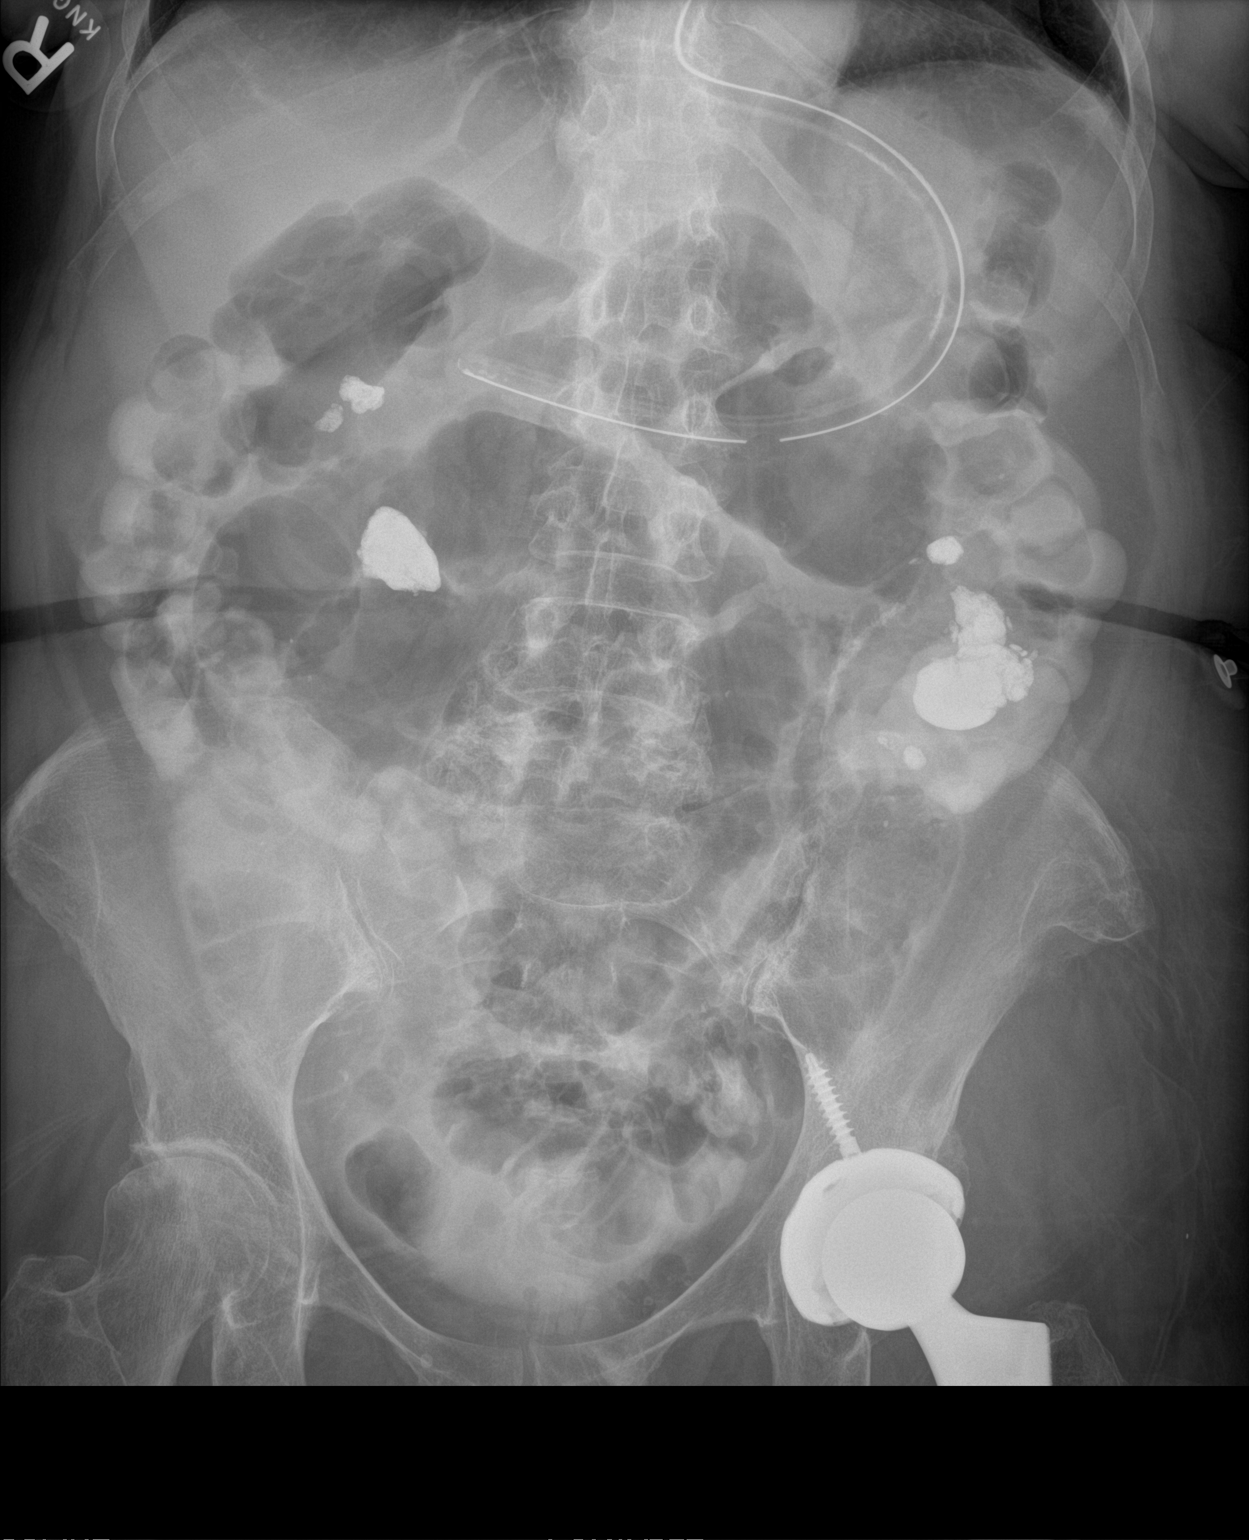

[1 of 1 positions shown; findings below may reference images not displayed]

FINDINGS: The NG tube tip is in the antropyloric region of the stomach.
Interval decrease and small bowel dilatation and progression of
contrast into the colon. Findings suggest a resolving small bowel
obstruction. No free air.
IMPRESSION: Resolving small bowel obstruction.

## 2017-03-06 IMAGING — DX DG ABDOMEN 1V
1 series · 1 of 1 positions shown · non-contrast
Comparison: December 24, 2015 and December 23, 2015

CLINICAL DATA: Recent bowel obstruction. Persistent abdominal
distention

EXAM:
ABDOMEN - 1 VIEW

[abdomen kub]
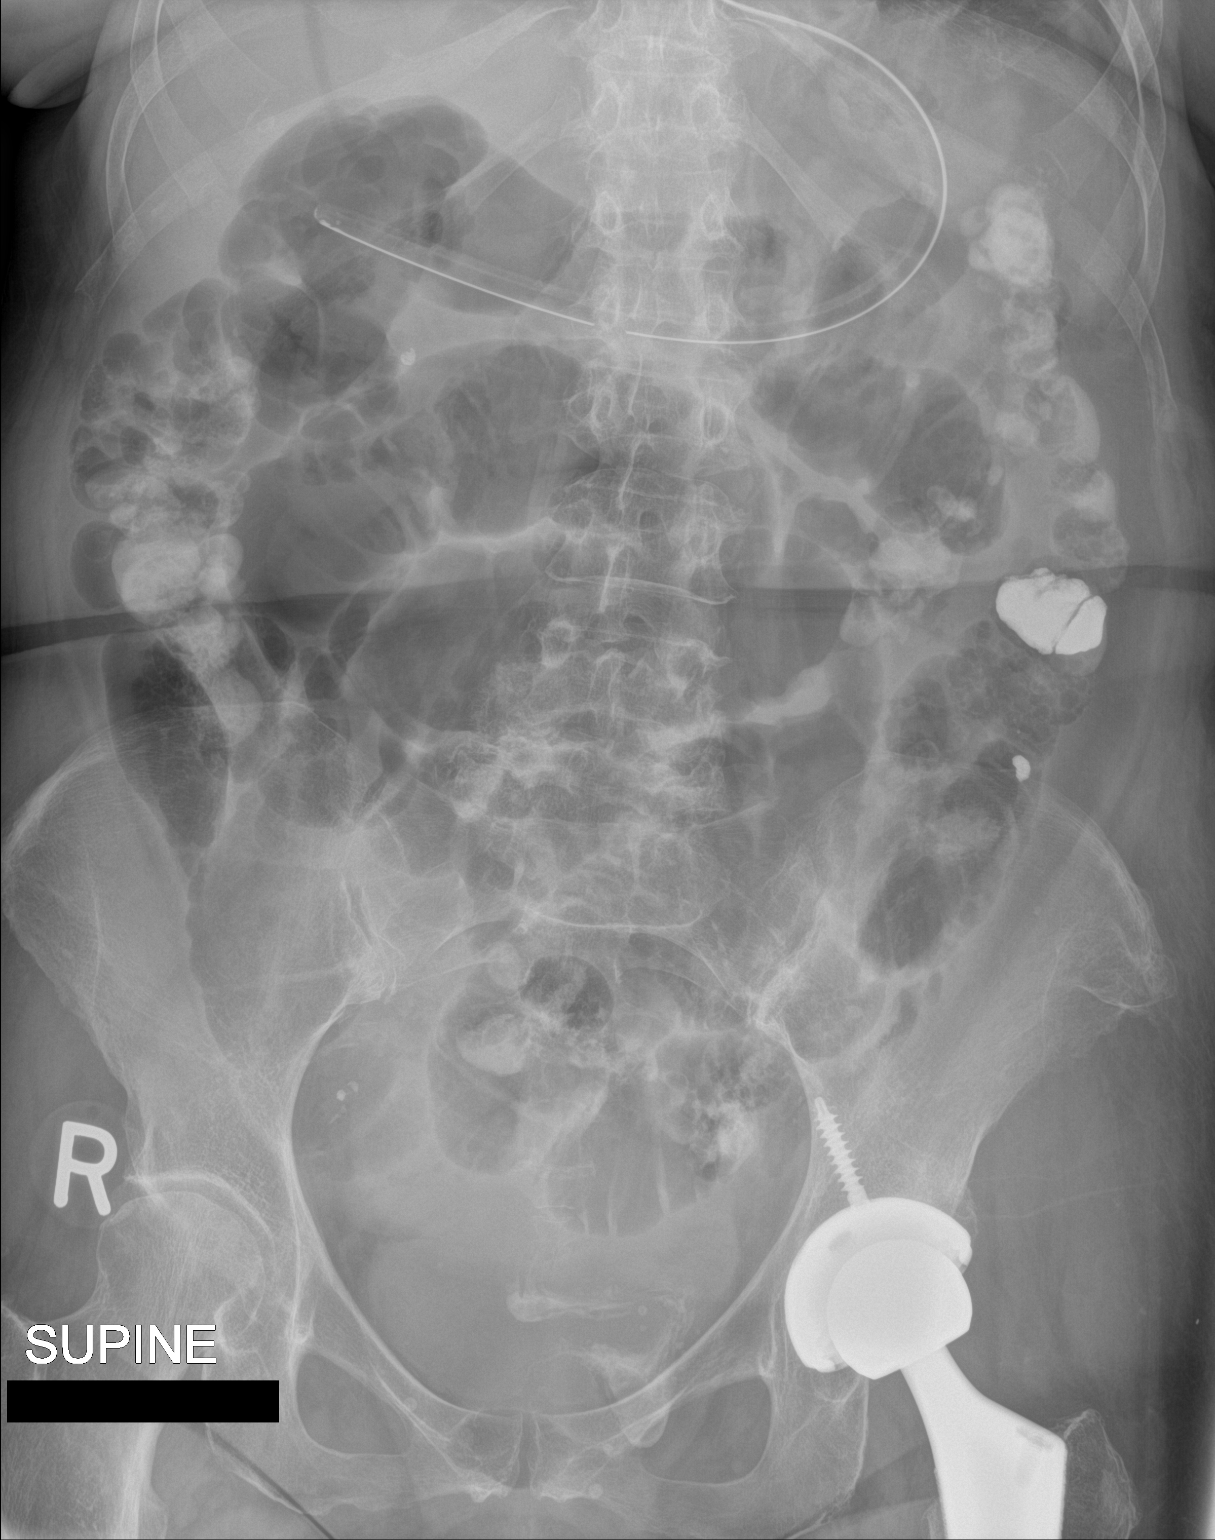

[1 of 1 positions shown; findings below may reference images not displayed]

FINDINGS: Nasogastric tube tip and side port are in the distal stomach. There
remain loops of dilated small bowel, stable. There is contrast in
the colon, unchanged. There is a total hip replacement on the left.
No free air is evident. However, there is air seen in the common
bile duct.
IMPRESSION: Nasogastric tube tip and side port in distal stomach. There remain
loops of dilated bowel, suggesting residual degree of partial
obstruction. Very little change from 1 day prior. No free air
evident. There is air apparent in the common bile duct. Question
whether patient has had a previous sphincterotomy procedure.

## 2017-03-08 IMAGING — DX DG ABD PORTABLE 1V
1 series · 1 of 1 positions shown · non-contrast
Comparison: 12/25/2015

CLINICAL DATA: Pt c/o abd discomfort when laying flat. F/u SBO, hx
diverticulitis

EXAM:
PORTABLE ABDOMEN - 1 VIEW

[abdomen kub]
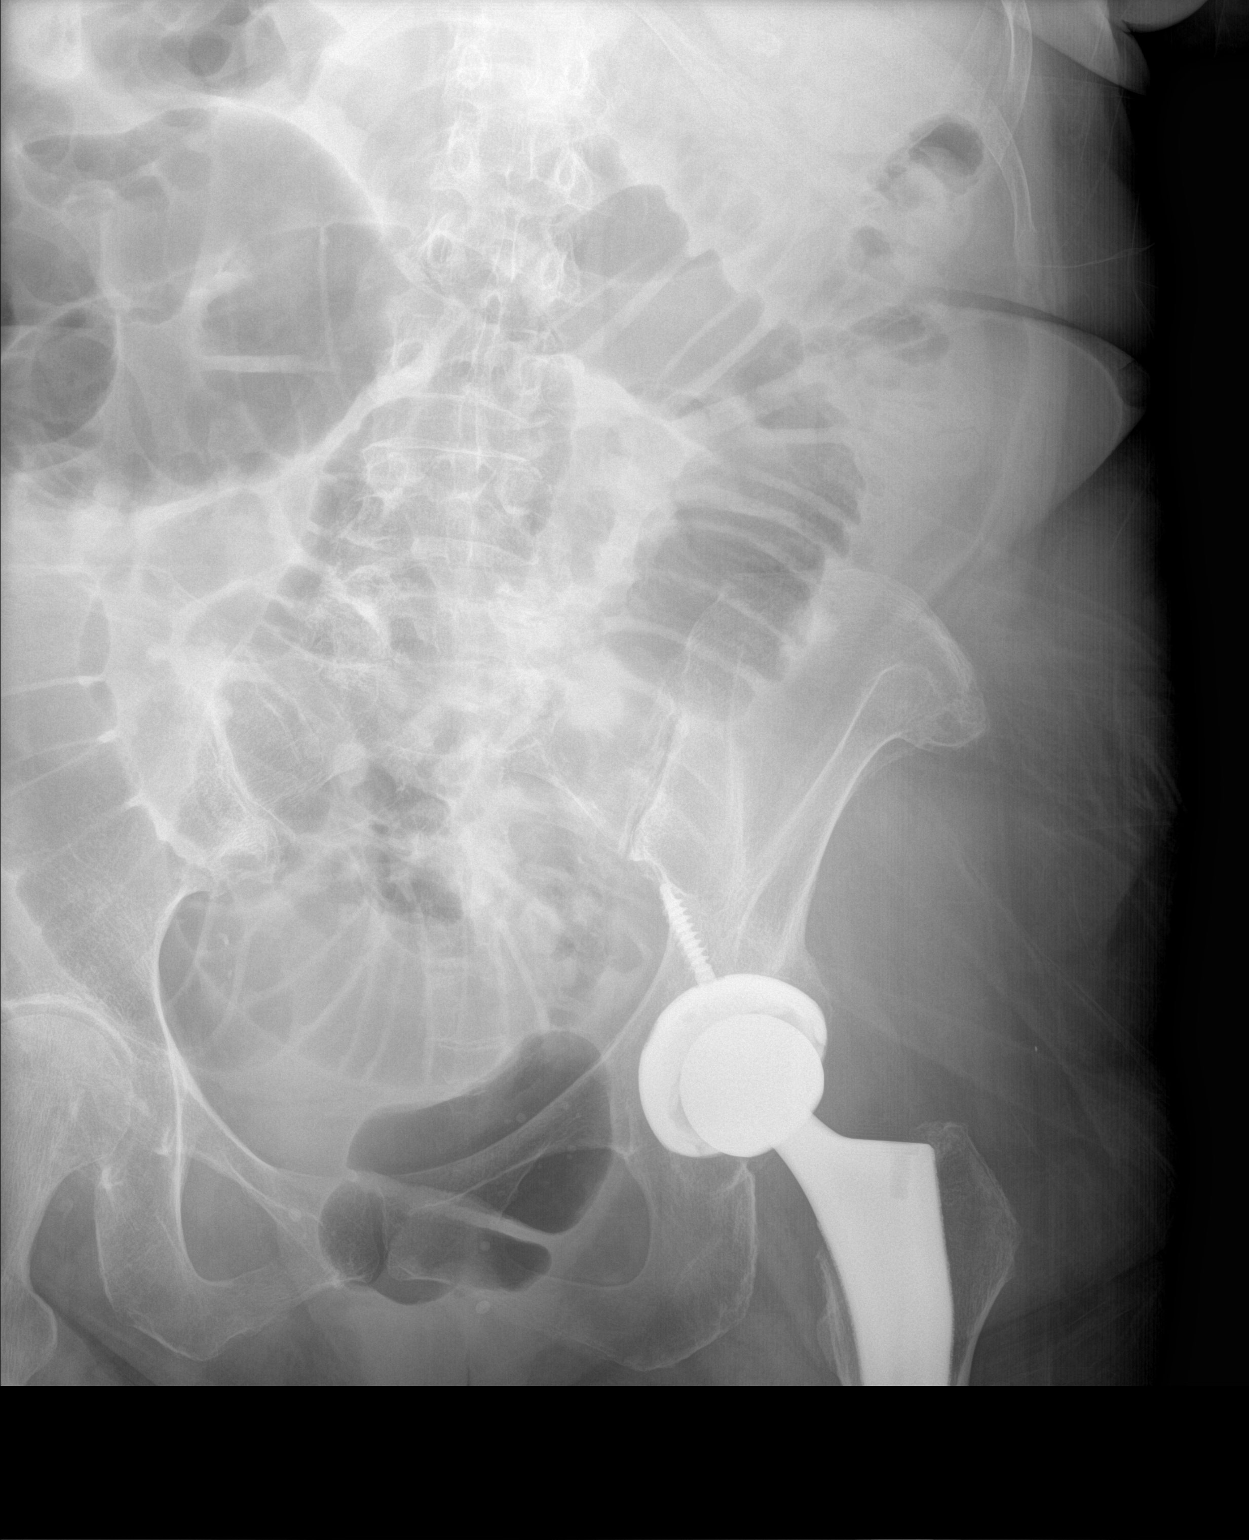

[1 of 1 positions shown; findings below may reference images not displayed]

FINDINGS: The right upper quadrant of the abdomen is excluded. Nasogastric
tube is no longer apparent but may be off the image. There is
moderate dilatation of small bowel loops, increased in diameter
since the prior study. There has been evacuation colonic contrast.
Left hip arthroplasty.
IMPRESSION: Some persistent component of small bowel obstruction.

## 2017-07-13 NOTE — Telephone Encounter (Signed)
Done

## 2017-09-14 DIAGNOSIS — Z8719 Personal history of other diseases of the digestive system: Secondary | ICD-10-CM | POA: Diagnosis not present

## 2017-09-14 DIAGNOSIS — I10 Essential (primary) hypertension: Secondary | ICD-10-CM | POA: Diagnosis not present

## 2017-09-14 DIAGNOSIS — Z8669 Personal history of other diseases of the nervous system and sense organs: Secondary | ICD-10-CM | POA: Diagnosis not present

## 2017-11-02 DIAGNOSIS — Z8639 Personal history of other endocrine, nutritional and metabolic disease: Secondary | ICD-10-CM | POA: Diagnosis not present

## 2017-11-02 DIAGNOSIS — R197 Diarrhea, unspecified: Secondary | ICD-10-CM | POA: Diagnosis not present

## 2017-11-02 DIAGNOSIS — R030 Elevated blood-pressure reading, without diagnosis of hypertension: Secondary | ICD-10-CM | POA: Diagnosis not present

## 2017-11-10 DIAGNOSIS — K5909 Other constipation: Secondary | ICD-10-CM | POA: Diagnosis not present

## 2017-11-10 DIAGNOSIS — R109 Unspecified abdominal pain: Secondary | ICD-10-CM | POA: Diagnosis not present

## 2017-11-10 DIAGNOSIS — Z8669 Personal history of other diseases of the nervous system and sense organs: Secondary | ICD-10-CM | POA: Diagnosis not present

## 2018-06-27 ENCOUNTER — Encounter (HOSPITAL_COMMUNITY): Payer: Self-pay

## 2018-06-27 ENCOUNTER — Other Ambulatory Visit: Payer: Self-pay

## 2018-06-27 ENCOUNTER — Emergency Department (HOSPITAL_COMMUNITY): Payer: PPO

## 2018-06-27 ENCOUNTER — Inpatient Hospital Stay (HOSPITAL_COMMUNITY)
Admission: EM | Admit: 2018-06-27 | Discharge: 2018-07-05 | DRG: 373 | Disposition: A | Discharge status: Home / Self Care | Payer: PPO | Attending: Surgery | Admitting: Surgery

## 2018-06-27 DIAGNOSIS — Z79899 Other long term (current) drug therapy: Secondary | ICD-10-CM | POA: Diagnosis not present

## 2018-06-27 DIAGNOSIS — K5909 Other constipation: Secondary | ICD-10-CM | POA: Diagnosis not present

## 2018-06-27 DIAGNOSIS — Z882 Allergy status to sulfonamides status: Secondary | ICD-10-CM

## 2018-06-27 DIAGNOSIS — Z881 Allergy status to other antibiotic agents status: Secondary | ICD-10-CM

## 2018-06-27 DIAGNOSIS — Z880 Allergy status to penicillin: Secondary | ICD-10-CM | POA: Diagnosis not present

## 2018-06-27 DIAGNOSIS — K5732 Diverticulitis of large intestine without perforation or abscess without bleeding: Secondary | ICD-10-CM | POA: Diagnosis not present

## 2018-06-27 DIAGNOSIS — Z91040 Latex allergy status: Secondary | ICD-10-CM

## 2018-06-27 DIAGNOSIS — G43909 Migraine, unspecified, not intractable, without status migrainosus: Secondary | ICD-10-CM | POA: Diagnosis not present

## 2018-06-27 DIAGNOSIS — F552 Abuse of laxatives: Secondary | ICD-10-CM | POA: Diagnosis present

## 2018-06-27 DIAGNOSIS — K219 Gastro-esophageal reflux disease without esophagitis: Secondary | ICD-10-CM | POA: Diagnosis not present

## 2018-06-27 DIAGNOSIS — Z888 Allergy status to other drugs, medicaments and biological substances status: Secondary | ICD-10-CM | POA: Diagnosis not present

## 2018-06-27 DIAGNOSIS — K3533 Acute appendicitis with perforation and localized peritonitis, with abscess: Principal | ICD-10-CM | POA: Diagnosis present

## 2018-06-27 DIAGNOSIS — K3532 Acute appendicitis with perforation and localized peritonitis, without abscess: Secondary | ICD-10-CM | POA: Diagnosis present

## 2018-06-27 DIAGNOSIS — K567 Ileus, unspecified: Secondary | ICD-10-CM | POA: Diagnosis not present

## 2018-06-27 DIAGNOSIS — Z96642 Presence of left artificial hip joint: Secondary | ICD-10-CM | POA: Diagnosis not present

## 2018-06-27 DIAGNOSIS — Z90711 Acquired absence of uterus with remaining cervical stump: Secondary | ICD-10-CM

## 2018-06-27 DIAGNOSIS — R198 Other specified symptoms and signs involving the digestive system and abdomen: Secondary | ICD-10-CM

## 2018-06-27 DIAGNOSIS — Z743 Need for continuous supervision: Secondary | ICD-10-CM | POA: Diagnosis not present

## 2018-06-27 DIAGNOSIS — Z885 Allergy status to narcotic agent status: Secondary | ICD-10-CM

## 2018-06-27 DIAGNOSIS — I10 Essential (primary) hypertension: Secondary | ICD-10-CM | POA: Diagnosis present

## 2018-06-27 DIAGNOSIS — K5790 Diverticulosis of intestine, part unspecified, without perforation or abscess without bleeding: Secondary | ICD-10-CM | POA: Diagnosis not present

## 2018-06-27 DIAGNOSIS — R0902 Hypoxemia: Secondary | ICD-10-CM | POA: Diagnosis not present

## 2018-06-27 DIAGNOSIS — K3580 Unspecified acute appendicitis: Secondary | ICD-10-CM | POA: Diagnosis present

## 2018-06-27 LAB — CBC
HCT: 42.5 % (ref 36.0–46.0)
HEMOGLOBIN: 13.6 g/dL (ref 12.0–15.0)
MCH: 30.7 pg (ref 26.0–34.0)
MCHC: 32 g/dL (ref 30.0–36.0)
MCV: 95.9 fL (ref 80.0–100.0)
NRBC: 0 % (ref 0.0–0.2)
Platelets: 216 10*3/uL (ref 150–400)
RBC: 4.43 MIL/uL (ref 3.87–5.11)
RDW: 14 % (ref 11.5–15.5)
WBC: 14.1 10*3/uL — AB (ref 4.0–10.5)

## 2018-06-27 LAB — COMPREHENSIVE METABOLIC PANEL
ALT: 13 U/L (ref 0–44)
AST: 19 U/L (ref 15–41)
Albumin: 4.4 g/dL (ref 3.5–5.0)
Alkaline Phosphatase: 73 U/L (ref 38–126)
Anion gap: 12 (ref 5–15)
BILIRUBIN TOTAL: 1.9 mg/dL — AB (ref 0.3–1.2)
BUN: 33 mg/dL — ABNORMAL HIGH (ref 8–23)
CO2: 27 mmol/L (ref 22–32)
Calcium: 9.6 mg/dL (ref 8.9–10.3)
Chloride: 103 mmol/L (ref 98–111)
Creatinine, Ser: 1.25 mg/dL — ABNORMAL HIGH (ref 0.44–1.00)
GFR, EST AFRICAN AMERICAN: 48 mL/min — AB (ref >60)
GFR, EST NON AFRICAN AMERICAN: 41 mL/min — AB (ref >60)
Glucose, Bld: 112 mg/dL — ABNORMAL HIGH (ref 70–99)
POTASSIUM: 3.8 mmol/L (ref 3.5–5.1)
Sodium: 142 mmol/L (ref 135–145)
TOTAL PROTEIN: 8 g/dL (ref 6.5–8.1)

## 2018-06-27 LAB — URINALYSIS, ROUTINE W REFLEX MICROSCOPIC
Bacteria, UA: NONE SEEN
Bilirubin Urine: NEGATIVE
Glucose, UA: NEGATIVE mg/dL
Ketones, ur: 20 mg/dL — AB
NITRITE: NEGATIVE
PH: 6 (ref 5.0–8.0)
Protein, ur: 30 mg/dL — AB
Specific Gravity, Urine: 1.044 — ABNORMAL HIGH (ref 1.005–1.030)

## 2018-06-27 LAB — I-STAT CG4 LACTIC ACID, ED: Lactic Acid, Venous: 0.72 mmol/L (ref 0.5–1.9)

## 2018-06-27 LAB — LIPASE, BLOOD: Lipase: 30 U/L (ref 11–51)

## 2018-06-27 MED ORDER — ONDANSETRON 4 MG PO TBDP
4.0000 mg | ORAL_TABLET | Freq: Four times a day (QID) | ORAL | Status: DC | PRN
  Administered 2018-06-29 – 2018-07-04 (×10): 4 mg via ORAL
  Filled 2018-06-27 (×10): qty 1

## 2018-06-27 MED ORDER — SODIUM CHLORIDE 0.9 % IV SOLN
2.0000 g | Freq: Once | INTRAVENOUS | Status: AC
  Administered 2018-06-27: 2 g via INTRAVENOUS
  Filled 2018-06-27: qty 2

## 2018-06-27 MED ORDER — HYDROMORPHONE HCL 1 MG/ML IJ SOLN
0.5000 mg | Freq: Once | INTRAMUSCULAR | Status: AC
  Administered 2018-06-27: 0.5 mg via INTRAVENOUS
  Filled 2018-06-27: qty 1

## 2018-06-27 MED ORDER — ONDANSETRON HCL 4 MG/2ML IJ SOLN
4.0000 mg | Freq: Once | INTRAMUSCULAR | Status: AC
  Administered 2018-06-27: 4 mg via INTRAVENOUS
  Filled 2018-06-27: qty 2

## 2018-06-27 MED ORDER — HYDROMORPHONE HCL 1 MG/ML IJ SOLN
0.5000 mg | INTRAMUSCULAR | Status: DC | PRN
  Administered 2018-06-28 – 2018-06-29 (×4): 0.5 mg via INTRAVENOUS
  Filled 2018-06-27 (×4): qty 0.5

## 2018-06-27 MED ORDER — METRONIDAZOLE IN NACL 5-0.79 MG/ML-% IV SOLN
500.0000 mg | Freq: Once | INTRAVENOUS | Status: AC
  Administered 2018-06-27: 500 mg via INTRAVENOUS
  Filled 2018-06-27: qty 100

## 2018-06-27 MED ORDER — HYDROCODONE-ACETAMINOPHEN 5-325 MG PO TABS
1.0000 | ORAL_TABLET | ORAL | Status: DC | PRN
  Administered 2018-06-28 – 2018-06-29 (×2): 1 via ORAL
  Administered 2018-06-29 – 2018-07-03 (×11): 2 via ORAL
  Administered 2018-07-04 – 2018-07-05 (×4): 1 via ORAL
  Administered 2018-07-05: 2 via ORAL
  Filled 2018-06-27: qty 1
  Filled 2018-06-27: qty 2
  Filled 2018-06-27: qty 1
  Filled 2018-06-27: qty 2
  Filled 2018-06-27: qty 1
  Filled 2018-06-27 (×4): qty 2
  Filled 2018-06-27: qty 1
  Filled 2018-06-27: qty 2
  Filled 2018-06-27 (×2): qty 1
  Filled 2018-06-27 (×5): qty 2

## 2018-06-27 MED ORDER — IOHEXOL 300 MG/ML  SOLN
75.0000 mL | Freq: Once | INTRAMUSCULAR | Status: AC | PRN
  Administered 2018-06-27: 75 mL via INTRAVENOUS

## 2018-06-27 MED ORDER — HYDRALAZINE HCL 20 MG/ML IJ SOLN
10.0000 mg | INTRAMUSCULAR | Status: DC | PRN
  Administered 2018-07-03 – 2018-07-04 (×3): 10 mg via INTRAVENOUS
  Filled 2018-06-27 (×3): qty 1

## 2018-06-27 MED ORDER — DEXTROSE-NACL 5-0.9 % IV SOLN
INTRAVENOUS | Status: DC
  Administered 2018-06-27 – 2018-07-05 (×11): via INTRAVENOUS

## 2018-06-27 MED ORDER — ONDANSETRON HCL 4 MG/2ML IJ SOLN
4.0000 mg | Freq: Four times a day (QID) | INTRAMUSCULAR | Status: DC | PRN
  Administered 2018-06-27 – 2018-07-05 (×12): 4 mg via INTRAVENOUS
  Filled 2018-06-27 (×12): qty 2

## 2018-06-27 NOTE — H&P (Signed)
Melinda Washington is an 77 y.o. female.   Chief Complaint: Abdominal pain HPI: Patient is a 77 year old female, with a history of diverticulitis, hypertension, migraine headaches, who comes in with periumbilical abdominal pain that started on Sunday.  She states this was associated with nausea and vomiting.  States this continued through that day and was relieved on Monday.  She states that the pain finally localized to the right lower quadrant.  She states that the pain was fairly intense and secondary to the intensity called 911 to be brought to the ER.  Upon evaluation in the ER she underwent CT scan laboratory studies.  CT scan does reveal appendicitis with abscess formation.  General surgery was asked to evaluate for further management.  Of note the patient has had previous midline laparotomy for which she states was perforated diverticulitis and 1993.  She states that in the same year she underwent open incisional hernia repair with mesh.  Patient states that she deals with on and off bouts of bowel obstructions which she treats at home with mag citrate.  Patient has been hospitalized once secondary to bowel obstructions.  Past Medical History:  Diagnosis Date  . Anxiety   . Arthritis   . Diverticulitis   . Headache    migraines  . Hypertension     Past Surgical History:  Procedure Laterality Date  . ABDOMINAL HYSTERECTOMY     Partial  . abdominal surgery due to barium enema prep - enlarged intestines which resulted in hernias.  Has mesh in abdomen from incisional hernias    . BUNIONECTOMY Left   . CARPAL TUNNEL RELEASE Right   . HERNIA REPAIR    . TONSILLECTOMY    . TOTAL HIP ARTHROPLASTY Left 08/19/2015   Procedure: TOTAL HIP ARTHROPLASTY ANTERIOR APPROACH;  Surgeon: Paralee Cancel, MD;  Location: [facility];  Service: Orthopedics;  Laterality: Left;    No family history on file. Social History:  reports that she has never smoked. She has never used smokeless tobacco. She reports that  she drinks alcohol. She reports that she does not use drugs.  Allergies:  Allergies  Allergen Reactions  . Sulfamethoxazole Rash  . Ciprofloxacin     Pt says it makes her feeling "very weak."   . Codeine Nausea And Vomiting    All pain medications cause some degree of nausea (moderate to severe)- able to tolerate when premedicated with antiemetic  . Ketorolac Tromethamine Anxiety    Tremors, teeth chattering  . Latex Rash    Redness & burning.  Marland Kitchen Penicillins Rash    Has patient had a PCN reaction causing immediate rash, facial/tongue/throat swelling, SOB or lightheadedness with hypotension: No Has patient had a PCN reaction causing severe rash involving mucus membranes or skin necrosis: No Has patient had a PCN reaction that required hospitalization No Has patient had a PCN reaction occurring within the last 10 years: No If all of the above answers are "NO", then may proceed with Cephalosporin use.      (Not in a hospital admission)  Results for orders placed or performed during the hospital encounter of 06/27/18 (from the past 48 hour(s))  Lipase, blood     Status: None   Collection Time: 06/27/18  3:58 PM  Result Value Ref Range   Lipase 30 11 - 51 U/L    Comment: Performed at [facility], [place] [address] [place], [place] [address]  Comprehensive metabolic panel     Status: Abnormal   Collection  Time: 06/27/18  3:58 PM  Result Value Ref Range   Sodium 142 135 - 145 mmol/L   Potassium 3.8 3.5 - 5.1 mmol/L   Chloride 103 98 - 111 mmol/L   CO2 27 22 - 32 mmol/L   Glucose, Bld 112 (H) 70 - 99 mg/dL   BUN 33 (H) 8 - 23 mg/dL   Creatinine, Ser 1.25 (H) 0.44 - 1.00 mg/dL   Calcium 9.6 8.9 - 10.3 mg/dL   Total Protein 8.0 6.5 - 8.1 g/dL   Albumin 4.4 3.5 - 5.0 g/dL   AST 19 15 - 41 U/L   ALT 13 0 - 44 U/L   Alkaline Phosphatase 73 38 - 126 U/L   Total Bilirubin 1.9 (H) 0.3 - 1.2 mg/dL   GFR calc non Af Amer 41 (L) >60 mL/min   GFR calc Af Amer 48 (L)  >60 mL/min    Comment: (NOTE) The eGFR has been calculated using the CKD EPI equation. This calculation has not been validated in all clinical situations. eGFR's persistently <60 mL/min signify possible Chronic Kidney Disease.    Anion gap 12 5 - 15    Comment: Performed at [facility], [place] [address] [place], [place] [address]  CBC     Status: Abnormal   Collection Time: 06/27/18  3:58 PM  Result Value Ref Range   WBC 14.1 (H) 4.0 - 10.5 K/uL   RBC 4.43 3.87 - 5.11 MIL/uL   Hemoglobin 13.6 12.0 - 15.0 g/dL   HCT 42.5 36.0 - 46.0 %   MCV 95.9 80.0 - 100.0 fL   MCH 30.7 26.0 - 34.0 pg   MCHC 32.0 30.0 - 36.0 g/dL   RDW 14.0 11.5 - 15.5 %   Platelets 216 150 - 400 K/uL   nRBC 0.0 0.0 - 0.2 %    Comment: Performed at [facility], [place] [address] [place], [place] [address]   Ct Abdomen Pelvis W Contrast  Result Date: 06/27/2018 CLINICAL DATA:  Pt states that since Saturday, she has had abd pain that started at the top, but has worked its way down to the RLQ. Pt has hx of obstruction EXAM: CT ABDOMEN AND PELVIS WITH CONTRAST TECHNIQUE: Multidetector CT imaging of the abdomen and pelvis was performed using the standard protocol following bolus administration of intravenous contrast. CONTRAST:  30m OMNIPAQUE IOHEXOL 300 MG/ML  SOLN COMPARISON:  CT of the abdomen and pelvis on 12/18/2015 FINDINGS: Lower chest: Minimal bibasilar atelectasis. The heart size is normal. Hepatobiliary: Liver is homogeneous. Calcified gallstones are present. No pericholecystic fluid. Pancreas: Unremarkable. No pancreatic ductal dilatation or surrounding inflammatory changes. Spleen: Normal in size without focal abnormality. Adrenals/Urinary Tract: Adrenal glands are normal in appearance. No hydronephrosis. Extrarenal pelvis noted bilaterally. There ureteral obstruction. Urinary bladder is partially obscured by artifact in the pelvis but is unremarkable. Stomach/Bowel: Small  hiatal hernia. The stomach is normal in appearance. Multiple duodenal diverticula are present. Small bowel loops in the UPPER abdomen are UPPER limits normal in size. The appendix is MEDIAL to the cecum and thickened, irregular, measuring up to 12 millimeters. There is periappendiceal fluid and an enhancing fluid collection adjacent to the base of the appendix which measures 2.8 x 2.8 centimeters. Findings are consistent with acute appendicitis and abscess. A small rim enhancing crescentic fluid collection is identified in the RIGHT pelvis, measuring 0.9 x 4.3 centimeters. There is inflammatory change in the cecum, likely secondary. There are scattered colonic diverticula. No acute diverticulitis.  Vascular/Lymphatic: There is atherosclerotic calcification of the abdominal aorta not associated with aneurysm. No retroperitoneal or mesenteric adenopathy. Reproductive: Hysterectomy.  No adnexal mass. Other: Small amount of fluid within the RIGHT LOWER QUADRANT mesentery. Anterior abdominal wall is unremarkable. Musculoskeletal: LEFT hip arthroplasty. Significant degenerative changes in the RIGHT hip. Bone island within the LEFT iliac wing and T12. IMPRESSION: 1. Appendiceal abscess measuring 2.8 x 2.8 centimeters. Acute inflammation of the appendix. No associated appendicolith. 2. RIGHT LOWER QUADRANT abscess 0.9 x 4.3 centimeters. 3. There is fluid within the RIGHT LOWER QUADRANT. 4. Ileus. 5.  Aortic atherosclerosis.  (ICD10-I70.0) 6. Hysterectomy. These results were called by telephone at the time of interpretation on 06/27/2018 at 7:43 pm to Dr. Charlesetta Shanks , who verbally acknowledged these results. Electronically Signed   By: Nolon Nations M.D.   On: 06/27/2018 19:44    Review of Systems  Constitutional: Negative for chills, fever and malaise/fatigue.  HENT: Negative for ear discharge, hearing loss and sore throat.   Eyes: Negative for blurred vision and discharge.  Respiratory: Negative for cough and  shortness of breath.   Cardiovascular: Negative for chest pain, orthopnea and leg swelling.  Gastrointestinal: Positive for abdominal pain, nausea and vomiting. Negative for constipation, diarrhea and heartburn.  Musculoskeletal: Negative for myalgias and neck pain.  Skin: Negative for itching and rash.  Neurological: Negative for dizziness, focal weakness, seizures and loss of consciousness.  Endo/Heme/Allergies: Negative for environmental allergies. Does not bruise/bleed easily.  Psychiatric/Behavioral: Negative for depression and suicidal ideas.  All other systems reviewed and are negative.   Blood pressure (!) 135/103, pulse 95, temperature 99.6 F (37.6 C), temperature source Oral, resp. rate 18, height 5' 3" (1.6 m), weight 64 kg, last menstrual period 04/04/2012, SpO2 95 %. Physical Exam  Constitutional: She is oriented to person, place, and time. Vital signs are normal. She appears well-developed and well-nourished.  Conversant No acute distress  Eyes: Pupils are equal, round, and reactive to light. Conjunctivae and lids are normal. No scleral icterus.  No lid lag Moist conjunctiva  Neck: Normal range of motion. No tracheal tenderness present. No thyromegaly present.  No cervical lymphadenopathy  Cardiovascular: Normal rate, regular rhythm and intact distal pulses.  No murmur heard. Respiratory: Effort normal and breath sounds normal. She has no wheezes. She has no rales.  GI: Soft. Bowel sounds are normal. She exhibits no distension and no mass. There is no hepatosplenomegaly. There is tenderness (RLQ pain). There is no rebound and no guarding. No hernia.  Musculoskeletal: Normal range of motion. She exhibits no edema, tenderness or deformity.  Neurological: She is alert and oriented to person, place, and time.  Normal gait and station  Skin: Skin is warm. No rash noted. No cyanosis. Nails show no clubbing.  Normal skin turgor  Psychiatric: Judgment normal.  Appropriate  affect     Assessment/Plan 77 year old female with perforated appendicitis and abscess formation Diverticulitis Hypertension Migraine headache  Plan: 1.  I have discussed the CT findings with the patient.  I discussed with her that secondary to her previous abdominal surgeries and likely adhesions she would be a patient at risk for damage to other parts of the intestine.  I will discuss the findings with Dr. Dema Severin and possible surgery in the morning. 2.  Continue with antibiotics 3.  A.m. labs  Ralene Ok, MD 06/27/2018, 8:38 PM

## 2018-06-27 NOTE — ED Triage Notes (Signed)
Per EMS-RLQ pain since Sunday-states patient called EMS-once EMS arrived patient looked out window telling them she wasn't ready and would let them know when she was-EMS had to call 911 to call patient and let them know they weren't waiting and were leaving-

## 2018-06-27 NOTE — ED Notes (Signed)
EMS report taken by Jerilee Hoh under this nurse's login.

## 2018-06-27 NOTE — ED Provider Notes (Signed)
[facility]-EMERGENCY DEPT Provider Note   CSN: [id number] Arrival date & time: 06/27/18  1445     History   Chief Complaint Chief Complaint  Patient presents with  . Abdominal Pain    HPI SHARRI LOYA is a 77 y.o. female.  HPI Patient had onset of pain 4 days ago.  She reports that onset it was in her central abdomen above and below the waist.  There was a cramping and aching quality.  Patient reports that she has had bowel obstruction due to adhesions in the past.  She reports about a year ago she was hospitalized for 11 days but had no surgical intervention.  She reports since then she found more successful ways to manage her situation at home.  Typically if she takes magnesium citrate and goes on a essentially clear diet she can resolve the situation.  Reports she does take magnesium citrate daily.  Her stool is at base, "like a faucet".  She reports she has not been vomiting but has become severely nauseated now and feels like vomiting.  Pain is gotten much more severe and is localized to her right lower and lateral quadrant.  No cough, no chest pain, no pain or burning with urination.  She denies any history of appendectomy.  Reports she has been told in the past that she had "gallstones". Past Medical History:  Diagnosis Date  . Anxiety   . Arthritis   . Diverticulitis   . Headache    migraines  . Hypertension     Patient Active Problem List   Diagnosis Date Noted  . Essential hypertension 12/19/2015  . Diverticulosis 12/19/2015  . SBO (small bowel obstruction) (HCC) 12/18/2015  . Overweight (BMI 25.0-29.9) 08/20/2015  . 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 Migraine 08/31/2012    Past Surgical History:  Procedure Laterality Date  . ABDOMINAL HYSTERECTOMY     Partial  . abdominal surgery due to barium enema prep - enlarged intestines which resulted in hernias.  Has mesh in abdomen from incisional hernias    . BUNIONECTOMY Left   . CARPAL TUNNEL  RELEASE Right   . HERNIA REPAIR    . TONSILLECTOMY    . TOTAL HIP ARTHROPLASTY Left 08/19/2015   Procedure: TOTAL HIP ARTHROPLASTY ANTERIOR APPROACH;  Surgeon: Durene Romans, MD;  Location: [facility];  Service: Orthopedics;  Laterality: Left;     OB History   None      Home Medications    Prior to Admission medications   Medication Sig Start Date End Date Taking? Authorizing Provider  Ascorbic Acid (VITAMIN C) 1000 MG tablet Take 4,000 mg by mouth 2 (two) times daily.    Yes [provider]  B Complex Vitamins (VITAMIN-B COMPLEX) TABS Take 1 tablet by mouth daily.    Yes [provider]  Cholecalciferol (VITAMIN D3) 1000 UNITS CAPS Take 4,000 Units by mouth daily.   Yes [provider]  DIGESTIVE ENZYMES PO Take 1 capsule by mouth 2 (two) times daily with a meal.    Yes [provider]  MAGNESIUM CITRATE PO Take 1,200 mg by mouth daily.   Yes [provider]  Menaquinone-7 (VITAMIN K2 PO) Take 80 mcg by mouth daily.   Yes [provider]  Multiple Vitamin (MULTIVITAMIN WITH MINERALS) TABS tablet Take 1 tablet by mouth daily.   Yes [provider]  Probiotic Product (PROBIOTIC DAILY PO) Take 1 capsule by mouth daily.    Yes [provider]  SUMAtriptan (IMITREX) 100 MG tablet Take 1 tablet (100 mg total) by mouth every 2 (two) hours as needed for migraine. May take one tablet on the onset of headache. Then repeat in 2 hours as needed. Maximum 2 doses in 24 hours 01/23/16  Yes Lauenstein, Kenyon Ana, MD  polyethylene glycol (MIRALAX / GLYCOLAX) packet Take 17 g by mouth daily as needed (no BM for 24 hours). Patient not taking: Reported on 2020/12/2617 12/28/15   Calvert Cantor, MD    Family History No family history on file.  Social History Social History   Tobacco Use  . Smoking status: Never Smoker  . Smokeless tobacco: Never Used  Substance Use Topics  . Alcohol use: Yes    Alcohol/week: 0.0 standard drinks     Comment: rarely  . Drug use: No     Allergies   Sulfamethoxazole; Ciprofloxacin; Codeine; Ketorolac tromethamine; Latex; and Penicillins   Review of Systems Review of Systems 10 Systems reviewed and are negative for acute change except as noted in the HPI.  Physical Exam Updated Vital Signs BP 129/84   Pulse 86   Temp 99.6 F (37.6 C) (Oral)   Resp 17   Ht 5\' 3"  (1.6 m)   Wt 64 kg   LMP 04/04/2012   SpO2 92%   BMI 25.01 kg/m   Physical Exam  Constitutional: She is oriented to person, place, and time. She appears well-developed and well-nourished.  She is a clinically well individual, well-nourished well-developed.  She appears to be in severe pain, moaning and holding her abdomen.  HENT:  Head: Normocephalic and atraumatic.  Mouth/Throat: Oropharynx is clear and moist.  Eyes: EOM are normal.  Cardiovascular: Normal rate, regular rhythm, normal heart sounds and intact distal pulses.  Pulmonary/Chest: Effort normal and breath sounds normal.  Abdominal: Soft. She exhibits no distension. There is tenderness. There is no guarding.  Kniffen tenderness to palpation right lower quadrant and lateral quadrant.  Musculoskeletal: Normal range of motion. She exhibits no edema, tenderness or deformity.  Neurological: She is alert and oriented to person, place, and time. No cranial nerve deficit. She exhibits normal muscle tone. Coordination normal.  Skin: Skin is warm and dry.  Psychiatric: She has a normal mood and affect.     ED Treatments / Results  Labs (all labs ordered are listed, but only abnormal results are displayed) Labs Reviewed  COMPREHENSIVE METABOLIC PANEL - Abnormal; Notable for the following components:      Result Value   Glucose, Bld 112 (*)    BUN 33 (*)    Creatinine, Ser 1.25 (*)    Total Bilirubin 1.9 (*)    GFR calc non Af Amer 41 (*)    GFR calc Af Amer 48 (*)    All other components within normal limits  CBC - Abnormal; Notable for the following  components:   WBC 14.1 (*)    All other components within normal limits  CULTURE, BLOOD (ROUTINE X 2)  CULTURE, BLOOD (ROUTINE X 2)  LIPASE, BLOOD  URINALYSIS, ROUTINE W REFLEX MICROSCOPIC  I-STAT CG4 LACTIC ACID, ED    EKG None  Radiology No results found.  Procedures Procedures (including critical care time) CRITICAL CARE Performed by: Arby Barrette   Total critical care time: 30 minutes  Critical care time was exclusive of separately billable procedures and treating other patients.  Critical care was necessary to treat or prevent imminent or life-threatening deterioration.  Critical care was time spent personally by me on  the following activities: development of treatment plan with patient and/or surrogate as well as nursing, discussions with consultants, evaluation of patient's response to treatment, examination of patient, obtaining history from patient or surrogate, ordering and performing treatments and interventions, ordering and review of laboratory studies, ordering and review of radiographic studies, pulse oximetry and re-evaluation of patient's condition. Medications Ordered in ED Medications  aztreonam (AZACTAM) 2 g in sodium chloride 0.9 % 100 mL IVPB (has no administration in time range)    And  metroNIDAZOLE (FLAGYL) IVPB 500 mg (has no administration in time range)  HYDROmorphone (DILAUDID) injection 0.5 mg (has no administration in time range)  HYDROmorphone (DILAUDID) injection 0.5 mg (0.5 mg Intravenous Given 06/27/18 1636)  ondansetron (ZOFRAN) injection 4 mg (4 mg Intravenous Given 06/27/18 1636)  iohexol (OMNIPAQUE) 300 MG/ML solution 75 mL (75 mLs Intravenous Contrast Given 06/27/18 1824)     Initial Impression / Assessment and Plan / ED Course  I have reviewed the triage vital signs and the nursing notes.  Pertinent labs & imaging results that were available during my care of the patient were reviewed by me and considered in my medical decision  making (see chart for details).  Clinical Course as of Jun 29 37  Tue Jun 27, 2018  1945 Consult to general surgery ordered.   [MP]  2026 Consult: Dr. Derrell Lolling will evaluate the CT scan and return call with recommendation.   [MP]    Clinical Course User Index [MP] Arby Barrette, MD   Antibiotics initiated.  Patient remains alert and appropriate.  She is nontoxic in appearance.  She is adequately pain control.  We admitted to Dr. Derrell Lolling.  Final Clinical Impressions(s) / ED Diagnoses   Final diagnoses:  Ruptured appendix    ED Discharge Orders    None       Arby Barrette, MD 06/29/18 [id number]

## 2018-06-27 NOTE — ED Triage Notes (Signed)
Pt states that since Saturday, she has had abd pain that started at the top, but has worked its way down to the RLQ. Pt has hx of obstruction.

## 2018-06-27 NOTE — ED Notes (Signed)
ED Provider at bedside. 

## 2018-06-27 NOTE — ED Notes (Signed)
ED TO INPATIENT HANDOFF REPORT  Name/Age/Gender Melinda Washington 77 y.o. female  Code Status    Code Status Orders  (From admission, onward)         Start     Ordered   06/27/18 2041  Full code  Continuous     06/27/18 2043        Code Status History    Date Active Date Inactive Code Status Order ID Comments User Context   12/19/2015 0031 12/28/2015 2142 Full Code [id number]  Reubin Milan, MD Inpatient   08/19/2015 1824 08/21/2015 1734 Full Code [id number]  Norman Herrlich Inpatient      Home/SNF/Other Home  Chief Complaint Abdominal pain  Level of Care/Admitting Diagnosis ED Disposition    ED Disposition Condition [place] [facility] Area: [facility] [[id number]]  Level of Care: Med-Surg [16]  Diagnosis: Acute appendicitis [789381]  Admitting Physician: CCS, [place]  Attending Physician: CCS, MD [[id number]]  Bed request comments: 5W  PT Class (Do Not Modify): Observation [104]  PT Acc Code (Do Not Modify): Observation [10022]       Medical History Past Medical History:  Diagnosis Date  . Anxiety   . Arthritis   . Diverticulitis   . Headache    migraines  . Hypertension     Allergies Allergies  Allergen Reactions  . Sulfamethoxazole Rash  . Ciprofloxacin     Pt says it makes her feeling "very weak."   . Codeine Nausea And Vomiting    All pain medications cause some degree of nausea (moderate to severe)- able to tolerate when premedicated with antiemetic  . Ketorolac Tromethamine Anxiety    Tremors, teeth chattering  . Latex Rash    Redness & burning.  Marland Kitchen Penicillins Rash    Has patient had a PCN reaction causing immediate rash, facial/tongue/throat swelling, SOB or lightheadedness with hypotension: No Has patient had a PCN reaction causing severe rash involving mucus membranes or skin necrosis: No Has patient had a PCN reaction that required hospitalization No Has patient had a PCN reaction occurring within the  last 10 years: No If all of the above answers are "NO", then may proceed with Cephalosporin use.     IV Location/Drains/Wounds Patient Lines/Drains/Airways Status   Active Line/Drains/Airways    Name:   Placement date:   Placement time:   Site:   Days:   Peripheral IV 06/27/18 Left;Upper Arm   06/27/18    1635    Arm   less than 1   Peripheral IV 06/27/18 Left Antecubital   06/27/18    2007    Antecubital   less than 1   Incision (Closed) 08/19/15 Hip Left   08/19/15    1445     1043          Labs/Imaging Results for orders placed or performed during the hospital encounter of 06/27/18 (from the past 48 hour(s))  Lipase, blood     Status: None   Collection Time: 06/27/18  3:58 PM  Result Value Ref Range   Lipase 30 11 - 51 U/L    Comment: Performed at [facility], [place] [address], [place], [place] [address]  Comprehensive metabolic panel     Status: Abnormal   Collection Time: 06/27/18  3:58 PM  Result Value Ref Range   Sodium 142 135 - 145 mmol/L   Potassium 3.8 3.5 - 5.1 mmol/L   Chloride 103 98 - 111 mmol/L  CO2 27 22 - 32 mmol/L   Glucose, Bld 112 (H) 70 - 99 mg/dL   BUN 33 (H) 8 - 23 mg/dL   Creatinine, Ser 1.25 (H) 0.44 - 1.00 mg/dL   Calcium 9.6 8.9 - 10.3 mg/dL   Total Protein 8.0 6.5 - 8.1 g/dL   Albumin 4.4 3.5 - 5.0 g/dL   AST 19 15 - 41 U/L   ALT 13 0 - 44 U/L   Alkaline Phosphatase 73 38 - 126 U/L   Total Bilirubin 1.9 (H) 0.3 - 1.2 mg/dL   GFR calc non Af Amer 41 (L) >60 mL/min   GFR calc Af Amer 48 (L) >60 mL/min    Comment: (NOTE) The eGFR has been calculated using the CKD EPI equation. This calculation has not been validated in all clinical situations. eGFR's persistently <60 mL/min signify possible Chronic Kidney Disease.    Anion gap 12 5 - 15    Comment: Performed at [facility], [place] [address] [place] [address], [place], [place] [address]  CBC     Status: Abnormal   Collection Time: 06/27/18  3:58 PM  Result Value Ref  Range   WBC 14.1 (H) 4.0 - 10.5 K/uL   RBC 4.43 3.87 - 5.11 MIL/uL   Hemoglobin 13.6 12.0 - 15.0 g/dL   HCT 42.5 36.0 - 46.0 %   MCV 95.9 80.0 - 100.0 fL   MCH 30.7 26.0 - 34.0 pg   MCHC 32.0 30.0 - 36.0 g/dL   RDW 14.0 11.5 - 15.5 %   Platelets 216 150 - 400 K/uL   nRBC 0.0 0.0 - 0.2 %    Comment: Performed at [facility], [place] [address] [place], [place] [address]   Ct Abdomen Pelvis W Contrast  Result Date: 06/27/2018 CLINICAL DATA:  Pt states that since Saturday, she has had abd pain that started at the top, but has worked its way down to the RLQ. Pt has hx of obstruction EXAM: CT ABDOMEN AND PELVIS WITH CONTRAST TECHNIQUE: Multidetector CT imaging of the abdomen and pelvis was performed using the standard protocol following bolus administration of intravenous contrast. CONTRAST:  27m OMNIPAQUE IOHEXOL 300 MG/ML  SOLN COMPARISON:  CT of the abdomen and pelvis on 12/18/2015 FINDINGS: Lower chest: Minimal bibasilar atelectasis. The heart size is normal. Hepatobiliary: Liver is homogeneous. Calcified gallstones are present. No pericholecystic fluid. Pancreas: Unremarkable. No pancreatic ductal dilatation or surrounding inflammatory changes. Spleen: Normal in size without focal abnormality. Adrenals/Urinary Tract: Adrenal glands are normal in appearance. No hydronephrosis. Extrarenal pelvis noted bilaterally. There ureteral obstruction. Urinary bladder is partially obscured by artifact in the pelvis but is unremarkable. Stomach/Bowel: Small hiatal hernia. The stomach is normal in appearance. Multiple duodenal diverticula are present. Small bowel loops in the UPPER abdomen are UPPER limits normal in size. The appendix is MEDIAL to the cecum and thickened, irregular, measuring up to 12 millimeters. There is periappendiceal fluid and an enhancing fluid collection adjacent to the base of the appendix which measures 2.8 x 2.8 centimeters. Findings are consistent with acute  appendicitis and abscess. A small rim enhancing crescentic fluid collection is identified in the RIGHT pelvis, measuring 0.9 x 4.3 centimeters. There is inflammatory change in the cecum, likely secondary. There are scattered colonic diverticula. No acute diverticulitis. Vascular/Lymphatic: There is atherosclerotic calcification of the abdominal aorta not associated with aneurysm. No retroperitoneal or mesenteric adenopathy. Reproductive: Hysterectomy.  No adnexal mass. Other: Small amount of fluid within the RIGHT LOWER QUADRANT mesentery. Anterior  abdominal wall is unremarkable. Musculoskeletal: LEFT hip arthroplasty. Significant degenerative changes in the RIGHT hip. Bone island within the LEFT iliac wing and T12. IMPRESSION: 1. Appendiceal abscess measuring 2.8 x 2.8 centimeters. Acute inflammation of the appendix. No associated appendicolith. 2. RIGHT LOWER QUADRANT abscess 0.9 x 4.3 centimeters. 3. There is fluid within the RIGHT LOWER QUADRANT. 4. Ileus. 5.  Aortic atherosclerosis.  (ICD10-I70.0) 6. Hysterectomy. These results were called by telephone at the time of interpretation on 06/27/2018 at 7:43 pm to Dr. Charlesetta Shanks , who verbally acknowledged these results. Electronically Signed   By: Nolon Nations M.D.   On: 06/27/2018 19:44    Pending Labs Unresulted Labs (From admission, onward)    Start     Ordered   06/28/18 0500  CBC  Tomorrow morning,   R     06/27/18 2043   06/27/18 1945  Culture, blood (routine x 2)  BLOOD CULTURE X 2,   STAT     06/27/18 1945   06/27/18 1540  Urinalysis, Routine w reflex microscopic  STAT,   STAT     06/27/18 1539          Vitals/Pain Today's Vitals   06/27/18 1837 06/27/18 1926 06/27/18 1935 06/27/18 2015  BP:  129/84  (!) 135/103  Pulse: 95 86  95  Resp:  17  18  Temp:      TempSrc:      SpO2: 95% 92%  95%  Weight:      Height:      PainSc:   0-No pain     Isolation Precautions No active isolations  Medications Medications   aztreonam (AZACTAM) 2 g in sodium chloride 0.9 % 100 mL IVPB (2 g Intravenous New Bag/Given 06/27/18 2019)    And  metroNIDAZOLE (FLAGYL) IVPB 500 mg (has no administration in time range)  dextrose 5 %-0.9 % sodium chloride infusion (has no administration in time range)  HYDROmorphone (DILAUDID) injection 0.5 mg (has no administration in time range)  HYDROcodone-acetaminophen (NORCO/VICODIN) 5-325 MG per tablet 1-2 tablet (has no administration in time range)  ondansetron (ZOFRAN-ODT) disintegrating tablet 4 mg (has no administration in time range)    Or  ondansetron (ZOFRAN) injection 4 mg (has no administration in time range)  hydrALAZINE (APRESOLINE) injection 10 mg (has no administration in time range)  HYDROmorphone (DILAUDID) injection 0.5 mg (0.5 mg Intravenous Given 06/27/18 1636)  ondansetron (ZOFRAN) injection 4 mg (4 mg Intravenous Given 06/27/18 1636)  iohexol (OMNIPAQUE) 300 MG/ML solution 75 mL (75 mLs Intravenous Contrast Given 06/27/18 1824)  HYDROmorphone (DILAUDID) injection 0.5 mg (0.5 mg Intravenous Given 06/27/18 2022)    Mobility manual wheelchair or with assistance

## 2018-06-28 DIAGNOSIS — Z881 Allergy status to other antibiotic agents status: Secondary | ICD-10-CM | POA: Diagnosis not present

## 2018-06-28 DIAGNOSIS — Z888 Allergy status to other drugs, medicaments and biological substances status: Secondary | ICD-10-CM | POA: Diagnosis not present

## 2018-06-28 DIAGNOSIS — Z91040 Latex allergy status: Secondary | ICD-10-CM | POA: Diagnosis not present

## 2018-06-28 DIAGNOSIS — F552 Abuse of laxatives: Secondary | ICD-10-CM | POA: Diagnosis present

## 2018-06-28 DIAGNOSIS — K5790 Diverticulosis of intestine, part unspecified, without perforation or abscess without bleeding: Secondary | ICD-10-CM | POA: Diagnosis present

## 2018-06-28 DIAGNOSIS — G43909 Migraine, unspecified, not intractable, without status migrainosus: Secondary | ICD-10-CM | POA: Diagnosis present

## 2018-06-28 DIAGNOSIS — K3533 Acute appendicitis with perforation and localized peritonitis, with abscess: Secondary | ICD-10-CM | POA: Diagnosis present

## 2018-06-28 DIAGNOSIS — Z880 Allergy status to penicillin: Secondary | ICD-10-CM | POA: Diagnosis not present

## 2018-06-28 DIAGNOSIS — I10 Essential (primary) hypertension: Secondary | ICD-10-CM | POA: Diagnosis present

## 2018-06-28 DIAGNOSIS — Z90711 Acquired absence of uterus with remaining cervical stump: Secondary | ICD-10-CM | POA: Diagnosis not present

## 2018-06-28 DIAGNOSIS — Z96642 Presence of left artificial hip joint: Secondary | ICD-10-CM | POA: Diagnosis present

## 2018-06-28 DIAGNOSIS — Z885 Allergy status to narcotic agent status: Secondary | ICD-10-CM | POA: Diagnosis not present

## 2018-06-28 DIAGNOSIS — K3532 Acute appendicitis with perforation and localized peritonitis, without abscess: Secondary | ICD-10-CM | POA: Diagnosis present

## 2018-06-28 DIAGNOSIS — Z882 Allergy status to sulfonamides status: Secondary | ICD-10-CM | POA: Diagnosis not present

## 2018-06-28 DIAGNOSIS — Z79899 Other long term (current) drug therapy: Secondary | ICD-10-CM | POA: Diagnosis not present

## 2018-06-28 DIAGNOSIS — K5909 Other constipation: Secondary | ICD-10-CM | POA: Diagnosis present

## 2018-06-28 DIAGNOSIS — K219 Gastro-esophageal reflux disease without esophagitis: Secondary | ICD-10-CM | POA: Diagnosis not present

## 2018-06-28 LAB — CBC
HCT: 36.7 % (ref 36.0–46.0)
Hemoglobin: 11.4 g/dL — ABNORMAL LOW (ref 12.0–15.0)
MCH: 30.1 pg (ref 26.0–34.0)
MCHC: 31.1 g/dL (ref 30.0–36.0)
MCV: 96.8 fL (ref 80.0–100.0)
NRBC: 0 % (ref 0.0–0.2)
PLATELETS: 188 10*3/uL (ref 150–400)
RBC: 3.79 MIL/uL — AB (ref 3.87–5.11)
RDW: 14 % (ref 11.5–15.5)
WBC: 11.7 10*3/uL — ABNORMAL HIGH (ref 4.0–10.5)

## 2018-06-28 MED ORDER — SODIUM CHLORIDE 0.9 % IV SOLN
1.0000 g | Freq: Once | INTRAVENOUS | Status: AC
  Administered 2018-06-28: 1 g via INTRAVENOUS
  Filled 2018-06-28: qty 1

## 2018-06-28 MED ORDER — SODIUM CHLORIDE 0.9 % IV SOLN
1.0000 g | Freq: Two times a day (BID) | INTRAVENOUS | Status: DC
  Administered 2018-06-28 – 2018-06-29 (×3): 1 g via INTRAVENOUS
  Filled 2018-06-28 (×4): qty 1

## 2018-06-28 MED ORDER — SUMATRIPTAN SUCCINATE 50 MG PO TABS
100.0000 mg | ORAL_TABLET | ORAL | Status: DC | PRN
  Administered 2018-06-28 – 2018-07-04 (×11): 100 mg via ORAL
  Filled 2018-06-28 (×14): qty 2

## 2018-06-28 NOTE — Progress Notes (Signed)
CC:  Abdominal pain since 06/24/18  Subjective: Pain is in the right lower quadrant.  She has had long-term problems with migraines, recurrent small bowel obstructions, and constipation.  She normally takes large doses of mag citrate tablets every day that are not listed on her medications.  She initially took him for her migraines and now she is doubled up to help with constipation and prevent small bowel obstructions.  Objective: Vital signs in last 24 hours: Temp:  [98.3 F (36.8 C)-99.6 F (37.6 C)] 98.7 F (37.1 C) (10/23 0520) Pulse Rate:  [69-96] 69 (10/23 0520) Resp:  [15-20] 20 (10/23 0520) BP: (110-144)/(80-103) 110/82 (10/23 0520) SpO2:  [92 %-99 %] 95 % (10/23 0520) Weight:  [64 kg] 64 kg (10/22 1538)    NPO Voided x 2 No BM Afebrile, VSS WBC down to 11.7 ? Urine culture - she has had one dose of Flagyl, Azactam and Meropenem - will not get a culture CT scan:  Appendiceal abscess measuring 2.8 x 2.8 centimeters. Acute inflammation of the appendix. No associated appendicolith. RIGHT LOWER QUADRANT abscess 0.9 x 4.3 centimeters. There is fluid within the RIGHT LOWER QUADRANT.   Ileus.  Intake/Output from previous day: 10/22 0701 - 10/23 0700 In: 758.8 [I.V.:758.8] Out: 0  Intake/Output this shift: No intake/output data recorded.  General appearance: alert, cooperative and no distress.  She does not remember a lot from last evening and her discussions with Dr. Derrell Lolling. Resp: clear to auscultation bilaterally GI: Tender in the right lower quadrant.  Lab Results:  Recent Labs    06/27/18 1558 06/28/18 0444  WBC 14.1* 11.7*  HGB 13.6 11.4*  HCT 42.5 36.7  PLT 216 188    BMET Recent Labs    06/27/18 1558  NA 142  K 3.8  CL 103  CO2 27  GLUCOSE 112*  BUN 33*  CREATININE 1.25*  CALCIUM 9.6   PT/INR No results for input(s): LABPROT, INR in the last 72 hours.  Recent Labs  Lab 06/27/18 1558  AST 19  ALT 13  ALKPHOS 73  BILITOT 1.9*  PROT  8.0  ALBUMIN 4.4     Lipase     Component Value Date/Time   LIPASE 30 Mar 20, 202019 1558   Prior to Admission medications   Medication Sig Start Date End Date Taking? Authorizing Provider  Ascorbic Acid (VITAMIN C) 1000 MG tablet Take 4,000 mg by mouth 2 (two) times daily.    Yes [provider]  B Complex Vitamins (VITAMIN-B COMPLEX) TABS Take 1 tablet by mouth daily.    Yes [provider]  Cholecalciferol (VITAMIN D3) 1000 UNITS CAPS Take 4,000 Units by mouth daily.   Yes [provider]  DIGESTIVE ENZYMES PO Take 1 capsule by mouth 2 (two) times daily with a meal.    Yes [provider]  MAGNESIUM CITRATE PO Take 1,200 mg by mouth daily.   Yes [provider]  Menaquinone-7 (VITAMIN K2 PO) Take 80 mcg by mouth daily.   Yes [provider]  Multiple Vitamin (MULTIVITAMIN WITH MINERALS) TABS tablet Take 1 tablet by mouth daily.   Yes [provider]  Probiotic Product (PROBIOTIC DAILY PO) Take 1 capsule by mouth daily.    Yes [provider]  SUMAtriptan (IMITREX) 100 MG tablet Take 1 tablet (100 mg total) by mouth every 2 (two) hours as needed for migraine. May take one tablet on the onset of headache. Then repeat in 2 hours as needed. Maximum 2 doses  in 24 hours 01/23/16  Yes Lauenstein, Kenyon Ana, MD  polyethylene glycol (MIRALAX / GLYCOLAX) packet Take 17 g by mouth daily as needed (no BM for 24 hours). Patient not taking: Reported on 03/23/202019 12/28/15   Calvert Cantor, MD      Medications:   . dextrose 5 % and 0.9% NaCl 75 mL/hr at 06/27/18 2144  . meropenem (MERREM) IV     Anti-infectives (From admission, onward)   Start     Dose/Rate Route Frequency Ordered Stop   06/28/18 1000  meropenem (MERREM) 1 g in sodium chloride 0.9 % 100 mL IVPB     1 g 200 mL/hr over 30 Minutes Intravenous Every 12 hours 06/28/18 0554     06/28/18 0030  meropenem (MERREM) 1 g in sodium chloride 0.9 % 100 mL IVPB     1 g 200 mL/hr  over 30 Minutes Intravenous  Once 06/28/18 0027 06/28/18 0117   06/27/18 1945  aztreonam (AZACTAM) 2 g in sodium chloride 0.9 % 100 mL IVPB     2 g 200 mL/hr over 30 Minutes Intravenous  Once 06/27/18 1944 06/27/18 2053   06/27/18 1945  metroNIDAZOLE (FLAGYL) IVPB 500 mg     500 mg 100 mL/hr over 60 Minutes Intravenous  Once 06/27/18 1944 06/27/18 2158    Assessment/Plan Hypertension Migraine headaches   Perforated appendix with abscess Hx of non perforated  Diverticulitis with exploratory lapartomy 1993, hernia repair with Mesh 1993, recurrent bouts of SBO  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  We will plan conservative treatment for now.  We discussed possibility that the sites are too small for drain.  We also discussed possible surgery if she becomes progressively worse and does not improve with conservative therapy.  LOS: 0 days    Clayborne Divis 06/28/2018 [id number]

## 2018-06-28 NOTE — Progress Notes (Signed)
Patient with history of recurrent small bowel obstructions, diverticulitis currently admitted to [facility] with perforated appendix with abscess.  IR consulted for possible aspiration/drainage.  Imaging reviewed by Dr. Fredia Sorrow who notes a small area of fluid, but no well-formed abscess accessible for drainage at this time.  Patient planning for conservative management with antibiotics.  Surgery team notified no plans for procedure in IR at this time.  IR remains available if clinical course changes or subsequent imaging shows further development of drainable collection.   Loyce Dys, MS RD PA-C 9:19 AM

## 2018-06-28 NOTE — Progress Notes (Signed)
Pharmacy Antibiotic Note  Melinda Washington is a 77 y.o. female admitted on Feb 10, 202019 with Intra-abdominal infection.  Pharmacy has been consulted for Meropenem dosing.  Plan: Meropenem 1gm iv q12hr  Height: 5\' 3"  (160 cm) Weight: 141 lb 3.2 oz (64 kg) IBW/kg (Calculated) : 52.4  Temp (24hrs), Avg:99.1 F (37.3 C), Min:98.3 F (36.8 C), Max:99.6 F (37.6 C)  Recent Labs  Lab 06/27/18 1558 06/27/18 2103 06/28/18 0444  WBC 14.1*  --  11.7*  CREATININE 1.25*  --   --   LATICACIDVEN  --  0.72  --     Estimated Creatinine Clearance: 35 mL/min (A) (by C-G formula based on SCr of 1.25 mg/dL (H)).    Allergies  Allergen Reactions  . Sulfamethoxazole Rash  . Ciprofloxacin     Pt says it makes her feeling "very weak."   . Codeine Nausea And Vomiting    All pain medications cause some degree of nausea (moderate to severe)- able to tolerate when premedicated with antiemetic  . Ketorolac Tromethamine Anxiety    Tremors, teeth chattering  . Latex Rash    Redness & burning.  Marland Kitchen Penicillins Rash    Has patient had a PCN reaction causing immediate rash, facial/tongue/throat swelling, SOB or lightheadedness with hypotension: No Has patient had a PCN reaction causing severe rash involving mucus membranes or skin necrosis: No Has patient had a PCN reaction that required hospitalization No Has patient had a PCN reaction occurring within the last 10 years: No If all of the above answers are "NO", then may proceed with Cephalosporin use.     Antimicrobials this admission: meropeme 06/28/2018 >>  Dose adjustments this admission: -  Microbiology results: -  Thank you for allowing pharmacy to be a part of this patient's care.  Aleene Davidson Crowford 06/28/2018 5:54 AM

## 2018-06-29 LAB — BASIC METABOLIC PANEL
ANION GAP: 6 (ref 5–15)
BUN: 13 mg/dL (ref 8–23)
CALCIUM: 8.5 mg/dL — AB (ref 8.9–10.3)
CO2: 25 mmol/L (ref 22–32)
Chloride: 110 mmol/L (ref 98–111)
Creatinine, Ser: 0.79 mg/dL (ref 0.44–1.00)
GFR calc non Af Amer: >60 mL/min (ref >60)
Glucose, Bld: 118 mg/dL — ABNORMAL HIGH (ref 70–99)
POTASSIUM: 3.2 mmol/L — AB (ref 3.5–5.1)
Sodium: 141 mmol/L (ref 135–145)

## 2018-06-29 LAB — CBC
HCT: 36.2 % (ref 36.0–46.0)
Hemoglobin: 11.2 g/dL — ABNORMAL LOW (ref 12.0–15.0)
MCH: 30.4 pg (ref 26.0–34.0)
MCHC: 30.9 g/dL (ref 30.0–36.0)
MCV: 98.1 fL (ref 80.0–100.0)
NRBC: 0 % (ref 0.0–0.2)
Platelets: 172 10*3/uL (ref 150–400)
RBC: 3.69 MIL/uL — ABNORMAL LOW (ref 3.87–5.11)
RDW: 13.6 % (ref 11.5–15.5)
WBC: 9.6 10*3/uL (ref 4.0–10.5)

## 2018-06-29 MED ORDER — METRONIDAZOLE IN NACL 5-0.79 MG/ML-% IV SOLN
500.0000 mg | Freq: Three times a day (TID) | INTRAVENOUS | Status: DC
  Administered 2018-06-29 – 2018-07-04 (×15): 500 mg via INTRAVENOUS
  Filled 2018-06-29 (×16): qty 100

## 2018-06-29 MED ORDER — PROMETHAZINE HCL 25 MG PO TABS
12.5000 mg | ORAL_TABLET | Freq: Four times a day (QID) | ORAL | Status: DC | PRN
  Administered 2018-06-29 – 2018-07-01 (×2): 12.5 mg via ORAL
  Filled 2018-06-29 (×2): qty 1

## 2018-06-29 MED ORDER — ENOXAPARIN SODIUM 40 MG/0.4ML ~~LOC~~ SOLN
40.0000 mg | SUBCUTANEOUS | Status: DC
  Administered 2018-06-29 – 2018-07-05 (×7): 40 mg via SUBCUTANEOUS
  Filled 2018-06-29 (×7): qty 0.4

## 2018-06-29 MED ORDER — SODIUM CHLORIDE 0.9 % IV SOLN
1.0000 g | Freq: Three times a day (TID) | INTRAVENOUS | Status: DC
  Filled 2018-06-29: qty 1

## 2018-06-29 MED ORDER — METHOCARBAMOL 500 MG PO TABS: 500.0000 mg | ORAL_TABLET | Freq: Three times a day (TID) | ORAL | Status: DC | PRN

## 2018-06-29 MED ORDER — SACCHAROMYCES BOULARDII 250 MG PO CAPS
250.0000 mg | ORAL_CAPSULE | Freq: Two times a day (BID) | ORAL | Status: DC
  Administered 2018-06-29 – 2018-07-05 (×13): 250 mg via ORAL
  Filled 2018-06-29 (×13): qty 1

## 2018-06-29 MED ORDER — SODIUM CHLORIDE 0.9 % IV SOLN
2.0000 g | INTRAVENOUS | Status: DC
  Administered 2018-06-29 – 2018-07-03 (×5): 2 g via INTRAVENOUS
  Filled 2018-06-29: qty 20
  Filled 2018-06-29 (×4): qty 2

## 2018-06-29 NOTE — Progress Notes (Signed)
I have reviewed and concur with this student's documentation.   

## 2018-06-29 NOTE — Progress Notes (Signed)
Pharmacy Antibiotic Note  Melinda Washington is a 77 y.o. female admitted on 2020/04/818 with Intra-abdominal infection; perforated appendix with abscess.  Pharmacy has been consulted for Meropenem dosing.  SCr improving (1.25 >> 0.79) WBC improved to WNL  Plan:  Meropenem 1gm IV q8h  Follow up renal fxn, culture results, and clinical course.  F/u ability to de-escalate antibiotics.    Height: 5\' 3"  (160 cm) Weight: 141 lb 3.2 oz (64 kg) IBW/kg (Calculated) : 52.4  Temp (24hrs), Avg:98.9 F (37.2 C), Min:98.3 F (36.8 C), Max:99.4 F (37.4 C)  Recent Labs  Lab 06/27/18 1558 06/27/18 2103 06/28/18 0444 06/29/18 0433  WBC 14.1*  --  11.7* 9.6  CREATININE 1.25*  --   --  0.79  LATICACIDVEN  --  0.72  --   --     Estimated Creatinine Clearance: 54.7 mL/min (by C-G formula based on SCr of 0.79 mg/dL).    Allergies  Allergen Reactions  . Sulfamethoxazole Rash  . Ciprofloxacin     Pt says it makes her feeling "very weak."   . Codeine Nausea And Vomiting    All pain medications cause some degree of nausea (moderate to severe)- able to tolerate when premedicated with antiemetic  . Ketorolac Tromethamine Anxiety    Tremors, teeth chattering  . Latex Rash    Redness & burning.  Marland Kitchen Penicillins Rash    Has patient had a PCN reaction causing immediate rash, facial/tongue/throat swelling, SOB or lightheadedness with hypotension: No Has patient had a PCN reaction causing severe rash involving mucus membranes or skin necrosis: No Has patient had a PCN reaction that required hospitalization No Has patient had a PCN reaction occurring within the last 10 years: No If all of the above answers are "NO", then may proceed with Cephalosporin use.     Antimicrobials this admission:  10/23 Meropenem >>   Dose adjustments this admission:  10/24 SCr improving, Meropenem dosage adjusted.  Microbiology results:  10/22 Blood: ngtd  Thank you for allowing pharmacy to be a part of this  patient's care.  Lynann Beaver PharmD, BCPS Pager [phone] 06/29/2018 1:45 PM

## 2018-06-29 NOTE — Progress Notes (Signed)
Student Nurse attempted to transfer patient from bed to chair multiple times during hourly rounding. Patient refused, stating "I just want to sleep, I've been bothered every half-hour."  Student Nurse educated patient on importance of mobility as it relates to healing and offered pain medication. Patient continued to refuse, stating "I'm sorry, I'm just not doing it."  Student Nurse informed primary nurse. Will continue to monitor.

## 2018-06-29 NOTE — Progress Notes (Signed)
[facility] Progress Note     Subjective: CC-  States that she was having more right sided abdominal pain this morning, recently took some dilaudid which helped. Denies n/v. States that she's hungry. No BM since admission. Did not get out of bed yesterday.   Objective: Vital signs in last 24 hours: Temp:  [98.3 F (36.8 C)-99.4 F (37.4 C)] 98.3 F (36.8 C) (10/24 0742) Pulse Rate:  [70-91] 70 (10/24 0742) Resp:  [16-20] 18 (10/24 0742) BP: (112-147)/(84-95) 139/89 (10/24 0742) SpO2:  [93 %-95 %] 95 % (10/24 0742)    Intake/Output from previous day: 10/23 0701 - 10/24 0700 In: 2184.3 [I.V.:1884.3; IV Piggyback:300] Out: 1050 [Urine:1050] Intake/Output this shift: No intake/output data recorded.  PE: Gen:  Alert, NAD HEENT: EOM's intact, pupils equal and round Card:  RRR Pulm:  CTAB, no W/R/R, effort normal Abd: Soft, ND, +BS, no HSM, TTP RLQ with voluntary guarding Psych: A&Ox3  Skin: no rashes noted, warm and dry  Lab Results:  Recent Labs    06/28/18 0444 06/29/18 0433  WBC 11.7* 9.6  HGB 11.4* 11.2*  HCT 36.7 36.2  PLT 188 172   BMET Recent Labs    06/27/18 1558 06/29/18 0433  NA 142 141  K 3.8 3.2*  CL 103 110  CO2 27 25  GLUCOSE 112* 118*  BUN 33* 13  CREATININE 1.25* 0.79  CALCIUM 9.6 8.5*   PT/INR No results for input(s): LABPROT, INR in the last 72 hours. CMP     Component Value Date/Time   NA 141 06/29/2018 0433   K 3.2 (L) 06/29/2018 0433   CL 110 06/29/2018 0433   CO2 25 06/29/2018 0433   GLUCOSE 118 (H) 06/29/2018 0433   BUN 13 06/29/2018 0433   CREATININE 0.79 06/29/2018 0433   CALCIUM 8.5 (L) 06/29/2018 0433   PROT 8.0 January 23, 202019 1558   ALBUMIN 4.4 January 23, 202019 1558   AST 19 January 23, 202019 1558   ALT 13 January 23, 202019 1558   ALKPHOS 73 January 23, 202019 1558   BILITOT 1.9 (H) January 23, 202019 1558   GFRNONAA >60 06/29/2018 0433   GFRAA >60 06/29/2018 0433   Lipase     Component Value Date/Time   LIPASE 30 January 23, 202019 1558        Studies/Results: Ct Abdomen Pelvis W Contrast  Result Date: January 23, 202019 CLINICAL DATA:  Pt states that since Saturday, she has had abd pain that started at the top, but has worked its way down to the RLQ. Pt has hx of obstruction EXAM: CT ABDOMEN AND PELVIS WITH CONTRAST TECHNIQUE: Multidetector CT imaging of the abdomen and pelvis was performed using the standard protocol following bolus administration of intravenous contrast. CONTRAST:  75mL OMNIPAQUE IOHEXOL 300 MG/ML  SOLN COMPARISON:  CT of the abdomen and pelvis on 12/18/2015 FINDINGS: Lower chest: Minimal bibasilar atelectasis. The heart size is normal. Hepatobiliary: Liver is homogeneous. Calcified gallstones are present. No pericholecystic fluid. Pancreas: Unremarkable. No pancreatic ductal dilatation or surrounding inflammatory changes. Spleen: Normal in size without focal abnormality. Adrenals/Urinary Tract: Adrenal glands are normal in appearance. No hydronephrosis. Extrarenal pelvis noted bilaterally. There ureteral obstruction. Urinary bladder is partially obscured by artifact in the pelvis but is unremarkable. Stomach/Bowel: Small hiatal hernia. The stomach is normal in appearance. Multiple duodenal diverticula are present. Small bowel loops in the UPPER abdomen are UPPER limits normal in size. The appendix is MEDIAL to the cecum and thickened, irregular, measuring up to 12 millimeters. There is periappendiceal fluid and an enhancing fluid collection adjacent to the  base of the appendix which measures 2.8 x 2.8 centimeters. Findings are consistent with acute appendicitis and abscess. A small rim enhancing crescentic fluid collection is identified in the RIGHT pelvis, measuring 0.9 x 4.3 centimeters. There is inflammatory change in the cecum, likely secondary. There are scattered colonic diverticula. No acute diverticulitis. Vascular/Lymphatic: There is atherosclerotic calcification of the abdominal aorta not associated with aneurysm. No  retroperitoneal or mesenteric adenopathy. Reproductive: Hysterectomy.  No adnexal mass. Other: Small amount of fluid within the RIGHT LOWER QUADRANT mesentery. Anterior abdominal wall is unremarkable. Musculoskeletal: LEFT hip arthroplasty. Significant degenerative changes in the RIGHT hip. Bone island within the LEFT iliac wing and T12. IMPRESSION: 1. Appendiceal abscess measuring 2.8 x 2.8 centimeters. Acute inflammation of the appendix. No associated appendicolith. 2. RIGHT LOWER QUADRANT abscess 0.9 x 4.3 centimeters. 3. There is fluid within the RIGHT LOWER QUADRANT. 4. Ileus. 5.  Aortic atherosclerosis.  (ICD10-I70.0) 6. Hysterectomy. These results were called by telephone at the time of interpretation on 15-Jun-202019 at 7:43 pm to Dr. Arby Barrette , who verbally acknowledged these results. Electronically Signed   By: Norva Pavlov M.D.   On: 15-Jun-202019 19:44    Anti-infectives: Anti-infectives (From admission, onward)   Start     Dose/Rate Route Frequency Ordered Stop   06/28/18 1000  meropenem (MERREM) 1 g in sodium chloride 0.9 % 100 mL IVPB     1 g 200 mL/hr over 30 Minutes Intravenous Every 12 hours 06/28/18 0554     06/28/18 0030  meropenem (MERREM) 1 g in sodium chloride 0.9 % 100 mL IVPB     1 g 200 mL/hr over 30 Minutes Intravenous  Once 06/28/18 0027 06/28/18 0117   06/27/18 1945  aztreonam (AZACTAM) 2 g in sodium chloride 0.9 % 100 mL IVPB     2 g 200 mL/hr over 30 Minutes Intravenous  Once 06/27/18 1944 06/27/18 2053   06/27/18 1945  metroNIDAZOLE (FLAGYL) IVPB 500 mg     500 mg 100 mL/hr over 60 Minutes Intravenous  Once 06/27/18 1944 06/27/18 2158       Assessment/Plan Hypertension Migraine headaches Hx of non perforated  Diverticulitis with exploratory lapartomy 1993, hernia repair with Mesh 1993, recurrent bouts of SBO  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 RLQ abscess 0.9 x 4.3 centimeters - IR unable to drain b/c the abscesses are not well formed and are too small  FEN:  sips of clears, IVF ID:  Meropenem 10/23 =>> day 2 DVT:  SCD, lovenox Foley:  none  Plan:  WBC WNL and VSS but patient still tender. Will allow sips of clears. Continue IV antibiotics. Repeat labs in AM. Encourage ambulation/OOB.   LOS: 1 day    Franne Forts , [facility] 06/29/2018, 8:27 AM Pager: [phone] Mon 7:00 am -11:30 AM Tues-Fri 7:00 am-4:30 pm Sat-Sun 7:00 am-11:30 am

## 2018-06-30 LAB — CBC
HEMATOCRIT: 34 % — AB (ref 36.0–46.0)
HEMOGLOBIN: 10.3 g/dL — AB (ref 12.0–15.0)
MCH: 29.9 pg (ref 26.0–34.0)
MCHC: 30.3 g/dL (ref 30.0–36.0)
MCV: 98.6 fL (ref 80.0–100.0)
Platelets: 163 10*3/uL (ref 150–400)
RBC: 3.45 MIL/uL — AB (ref 3.87–5.11)
RDW: 13.5 % (ref 11.5–15.5)
WBC: 8.4 10*3/uL (ref 4.0–10.5)
nRBC: 0 % (ref 0.0–0.2)

## 2018-06-30 LAB — BASIC METABOLIC PANEL
Anion gap: 5 (ref 5–15)
BUN: 8 mg/dL (ref 8–23)
CHLORIDE: 111 mmol/L (ref 98–111)
CO2: 26 mmol/L (ref 22–32)
Calcium: 8.5 mg/dL — ABNORMAL LOW (ref 8.9–10.3)
Creatinine, Ser: 0.8 mg/dL (ref 0.44–1.00)
GFR calc Af Amer: >60 mL/min (ref >60)
GFR calc non Af Amer: >60 mL/min (ref >60)
GLUCOSE: 106 mg/dL — AB (ref 70–99)
POTASSIUM: 3.4 mmol/L — AB (ref 3.5–5.1)
Sodium: 142 mmol/L (ref 135–145)

## 2018-06-30 MED ORDER — POLYETHYLENE GLYCOL 3350 17 G PO PACK
17.0000 g | PACK | Freq: Two times a day (BID) | ORAL | Status: DC
  Administered 2018-06-30 – 2018-07-01 (×3): 17 g via ORAL
  Filled 2018-06-30 (×8): qty 1

## 2018-06-30 MED ORDER — MAGNESIUM CITRATE PO SOLN
1.0000 | Freq: Once | ORAL | Status: AC
  Administered 2018-06-30: 1 via ORAL
  Filled 2018-06-30: qty 296

## 2018-06-30 NOTE — Progress Notes (Addendum)
[facility] Progress Note     Subjective: CC-  Feeling fine today - still with right sided discomfort. Denies n/v. Some distention as well. No BM since admission but passing flatus. Ambulating  Objective: Vital signs in last 24 hours: Temp:  [98.5 F (36.9 C)-100.6 F (38.1 C)] 98.5 F (36.9 C) (10/25 0717) Pulse Rate:  [70-90] 70 (10/25 0701) Resp:  [16-18] 18 (10/25 0659) BP: (103-156)/(84-99) 143/96 (10/25 0701) SpO2:  [91 %-97 %] 97 % (10/25 0659) Last BM Date: 06/25/18  Intake/Output from previous day: 10/24 0701 - 10/25 0700 In: 2500 [P.O.:600; I.V.:1500; IV Piggyback:400] Out: 975 [Urine:975] Intake/Output this shift: No intake/output data recorded.  PE: Gen:  Alert, NAD HEENT: EOM's intact, pupils equal and round Card:  RRR Pulm:  CTAB, no W/R/R, effort normal Abd: Soft, ND, no HSM, mildly ttp in RLQ Psych: A&Ox3  Skin: no rashes noted, warm and dry  Lab Results:  Recent Labs    06/29/18 0433 06/30/18 0358  WBC 9.6 8.4  HGB 11.2* 10.3*  HCT 36.2 34.0*  PLT 172 163   BMET Recent Labs    06/29/18 0433 06/30/18 0358  NA 141 142  K 3.2* 3.4*  CL 110 111  CO2 25 26  GLUCOSE 118* 106*  BUN 13 8  CREATININE 0.79 0.80  CALCIUM 8.5* 8.5*   PT/INR No results for input(s): LABPROT, INR in the last 72 hours. CMP     Component Value Date/Time   NA 142 06/30/2018 0358   K 3.4 (L) 06/30/2018 0358   CL 111 06/30/2018 0358   CO2 26 06/30/2018 0358   GLUCOSE 106 (H) 06/30/2018 0358   BUN 8 06/30/2018 0358   CREATININE 0.80 06/30/2018 0358   CALCIUM 8.5 (L) 06/30/2018 0358   PROT 8.0 2020/01/2818 1558   ALBUMIN 4.4 2020/01/2818 1558   AST 19 2020/01/2818 1558   ALT 13 2020/01/2818 1558   ALKPHOS 73 2020/01/2818 1558   BILITOT 1.9 (H) 2020/01/2818 1558   GFRNONAA >60 06/30/2018 0358   GFRAA >60 06/30/2018 0358   Lipase     Component Value Date/Time   LIPASE 30 2020/01/2818 1558       Studies/Results: No results  found.  Anti-infectives: Anti-infectives (From admission, onward)   Start     Dose/Rate Route Frequency Ordered Stop   06/29/18 2000  meropenem (MERREM) 1 g in sodium chloride 0.9 % 100 mL IVPB  Status:  Discontinued     1 g 200 mL/hr over 30 Minutes Intravenous Every 8 hours 06/29/18 1356 06/29/18 1527   06/29/18 1800  cefTRIAXone (ROCEPHIN) 2 g in sodium chloride 0.9 % 100 mL IVPB     2 g 200 mL/hr over 30 Minutes Intravenous Every 24 hours 06/29/18 1527     06/29/18 1800  metroNIDAZOLE (FLAGYL) IVPB 500 mg     500 mg 100 mL/hr over 60 Minutes Intravenous Every 8 hours 06/29/18 1527     06/28/18 1000  meropenem (MERREM) 1 g in sodium chloride 0.9 % 100 mL IVPB  Status:  Discontinued     1 g 200 mL/hr over 30 Minutes Intravenous Every 12 hours 06/28/18 0554 06/29/18 1356   06/28/18 0030  meropenem (MERREM) 1 g in sodium chloride 0.9 % 100 mL IVPB     1 g 200 mL/hr over 30 Minutes Intravenous  Once 06/28/18 0027 06/28/18 0117   06/27/18 1945  aztreonam (AZACTAM) 2 g in sodium chloride 0.9 % 100 mL IVPB     2 g  200 mL/hr over 30 Minutes Intravenous  Once 06/27/18 1944 06/27/18 2053   06/27/18 1945  metroNIDAZOLE (FLAGYL) IVPB 500 mg     500 mg 100 mL/hr over 60 Minutes Intravenous  Once 06/27/18 1944 06/27/18 2158       Assessment/Plan Hypertension Migraine headaches Hx of non perforated  Diverticulitis with exploratory lapartomy 1993, hernia repair with Mesh 1993, recurrent bouts of SBO  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 RLQ abscess 0.9 x 4.3 centimeters - IR unable to drain b/c the abscesses are not well formed and are too small  FEN:  sips of clears, IVF ID:  Meropenem 10/23 =>> 10/24; Ceftriaxone+flagyl 10/24 =>> DVT:  SCD, lovenox Foley:  none  Plan:  WBC WNL and VSS  but patient still tender. Full liquids. Miralax. Magnesium citrate per her request. Continue IV antibiotics. Repeat labs in AM.   LOS: 2 days

## 2018-07-01 LAB — BASIC METABOLIC PANEL
ANION GAP: 8 (ref 5–15)
BUN: 7 mg/dL — ABNORMAL LOW (ref 8–23)
CO2: 26 mmol/L (ref 22–32)
Calcium: 8.5 mg/dL — ABNORMAL LOW (ref 8.9–10.3)
Chloride: 109 mmol/L (ref 98–111)
Creatinine, Ser: 0.69 mg/dL (ref 0.44–1.00)
GFR calc Af Amer: >60 mL/min (ref >60)
GLUCOSE: 107 mg/dL — AB (ref 70–99)
POTASSIUM: 3.4 mmol/L — AB (ref 3.5–5.1)
Sodium: 143 mmol/L (ref 135–145)

## 2018-07-01 LAB — CBC
HCT: 35.8 % — ABNORMAL LOW (ref 36.0–46.0)
Hemoglobin: 10.8 g/dL — ABNORMAL LOW (ref 12.0–15.0)
MCH: 29.7 pg (ref 26.0–34.0)
MCHC: 30.2 g/dL (ref 30.0–36.0)
MCV: 98.4 fL (ref 80.0–100.0)
NRBC: 0 % (ref 0.0–0.2)
PLATELETS: 221 10*3/uL (ref 150–400)
RBC: 3.64 MIL/uL — AB (ref 3.87–5.11)
RDW: 13.3 % (ref 11.5–15.5)
WBC: 7.8 10*3/uL (ref 4.0–10.5)

## 2018-07-01 MED ORDER — MAGNESIUM CITRATE PO SOLN
1.0000 | Freq: Two times a day (BID) | ORAL | Status: DC | PRN
  Administered 2018-07-01 – 2018-07-05 (×2): 1 via ORAL
  Filled 2018-07-01 (×4): qty 296

## 2018-07-01 MED ORDER — ALUM & MAG HYDROXIDE-SIMETH 200-200-20 MG/5ML PO SUSP: 30.0000 mL | ORAL | Status: DC | PRN

## 2018-07-01 NOTE — Progress Notes (Signed)
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 RLQ abscess 0.9 x 4.3 centimeters - IR unable to drain b/c the abscesses are not well formed and are too small  FEN: sips of clears, IVF ID: Meropenem 10/23 =>>10/24; Ceftriaxone+flagyl 10/24 =>> DVT: SCD, lovenox Foley: none  Plan:  Continue IV abx's through tomorrow  Full liquid diet today  Encouraged ambulation  MagCitrate prn per patient request  Mylanta for acid reflux symptoms per patient request         Darnell Level, MD       [facility]       Office: [phone]   Chief Complaint: Perforated appendicitis  Subjective: Patient up in room, appears comfortable.  Complains of acid reflux symptoms.  Having loose BM's after mag citrate dose.  Objective: Vital signs in last 24 hours: Temp:  [98.5 F (36.9 C)-98.8 F (37.1 C)] 98.8 F (37.1 C) (10/25 2137) Pulse Rate:  [75-83] 83 (10/25 2137) Resp:  [16] 16 (10/25 2137) BP: (146-148)/(98-99) 148/98 (10/25 2240) SpO2:  [73 %-95 %] 73 % (10/25 2137) Last BM Date: 07/01/18  Intake/Output from previous day: 10/25 0701 - 10/26 0700 In: 3289.8 [P.O.:1240; I.V.:1664.7; IV Piggyback:385.2] Out: -  Intake/Output this shift: Total I/O In: -  Out: 325 [Urine:325]  Physical Exam: HEENT - sclerae clear, mucous membranes moist Neck - soft Abdomen - soft without distension; mild tenderness to palpation RLQ; no mass; no guarding or rebound Ext - no edema, non-tender Neuro - alert & oriented, no focal deficits  Lab Results:  Recent Labs    06/30/18 0358 07/01/18 0345  WBC 8.4 7.8    HGB 10.3* 10.8*  HCT 34.0* 35.8*  PLT 163 221   BMET Recent Labs    06/30/18 0358 07/01/18 0345  NA 142 143  K 3.4* 3.4*  CL 111 109  CO2 26 26  GLUCOSE 106* 107*  BUN 8 7*  CREATININE 0.80 0.69  CALCIUM 8.5* 8.5*   PT/INR No results for input(s): LABPROT, INR in the last 72 hours. Comprehensive Metabolic Panel:    Component Value Date/Time   NA 143 07/01/2018 0345   NA 142 06/30/2018 0358   K 3.4 (L) 07/01/2018 0345   K 3.4 (L) 06/30/2018 0358   CL 109 07/01/2018 0345   CL 111 06/30/2018 0358   CO2 26 07/01/2018 0345   CO2 26 06/30/2018 0358   BUN 7 (L) 07/01/2018 0345   BUN 8 06/30/2018 0358   CREATININE 0.69 07/01/2018 0345   CREATININE 0.80 06/30/2018 0358   GLUCOSE 107 (H) 07/01/2018 0345   GLUCOSE 106 (H) 06/30/2018 0358   CALCIUM 8.5 (L) 07/01/2018 0345   CALCIUM 8.5 (L) 06/30/2018 0358   AST 19 09-30-2017 1558   AST 16 12/25/2015 0527   ALT 13 09-30-2017 1558   ALT 15 12/25/2015 0527   ALKPHOS 73 09-30-2017 1558   ALKPHOS 64 12/25/2015 0527   BILITOT 1.9 (H) 09-30-2017 1558   BILITOT 0.7 12/25/2015 0527   PROT 8.0 09-30-2017 1558   PROT 5.4 (L) 12/25/2015 0527   ALBUMIN 4.4 09-30-2017 1558   ALBUMIN 2.8 (L) 12/25/2015 0527    Studies/Results: No results found.    Melinda [facility] 07/01/2018  Patient ID: [facility]inda [facility], female   DOB: 1941-06-20, 77 y.o.   MRN: [id number]

## 2018-07-02 LAB — CULTURE, BLOOD (ROUTINE X 2)
CULTURE: NO GROWTH
Culture: NO GROWTH
SPECIAL REQUESTS: ADEQUATE

## 2018-07-02 NOTE — Progress Notes (Signed)
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 RLQ abscess 0.9 x 4.3 centimeters - IR unable to drain b/c the abscesses are not well formed and are too small  FEN: sips of clears, IVF ID: Meropenem 10/23 =>>10/24; Ceftriaxone+flagyl 10/24 =>> DVT: SCD, lovenox Foley: none  Plan:             Continue IV abx's             Full liquid diet             Encouraged ambulation             MagCitrate prn per patient request             Mylanta for acid reflux symptoms per patient request         Melinda Level, MD       [facility]       Office: [phone]   Chief Complaint: Acute appendicitis with small abscess  Subjective: Patient comfortable, up to bathroom.  Objective: Vital signs in last 24 hours: Temp:  [98.7 F (37.1 C)-99.2 F (37.3 C)] 99.2 F (37.3 C) (10/27 8469) Pulse Rate:  [70-88] 70 (10/27 0638) Resp:  [16] 16 (10/27 6295) BP: (140-159)/(90-101) 159/101 (10/27 0638) SpO2:  [97 %-99 %] 99 % (10/27 2841) Last BM Date: 07/02/18  Intake/Output from previous day: 10/26 0701 - 10/27 0700 In: 3882.7 [P.O.:1765; I.V.:1692.6; IV Piggyback:425.1] Out: 5550 [Urine:5550] Intake/Output this shift: No intake/output data recorded.  Physical Exam: HEENT - sclerae clear, mucous membranes moist Abdomen - soft, mild tenderness LLQ, RLQ non-tender without mass Ext - no edema, non-tender Neuro - alert & oriented, no focal deficits  Lab Results:  Recent Labs    06/30/18 0358 07/01/18 0345  WBC 8.4 7.8  HGB 10.3* 10.8*  HCT 34.0* 35.8*  PLT 163 221   BMET Recent Labs   06/30/18 0358 07/01/18 0345  NA 142 143  K 3.4* 3.4*  CL 111 109  CO2 26 26  GLUCOSE 106* 107*  BUN 8 7*  CREATININE 0.80 0.69  CALCIUM 8.5* 8.5*   PT/INR No results for input(s): LABPROT, INR in the last 72 hours. Comprehensive Metabolic Panel:    Component Value Date/Time   NA 143 07/01/2018 0345   NA 142 06/30/2018 0358   K 3.4 (L) 07/01/2018 0345   K 3.4 (L) 06/30/2018 0358   CL 109 07/01/2018 0345   CL 111 06/30/2018 0358   CO2 26 07/01/2018 0345   CO2 26 06/30/2018 0358   BUN 7 (L) 07/01/2018 0345   BUN 8 06/30/2018 0358   CREATININE 0.69 07/01/2018 0345   CREATININE 0.80 06/30/2018 0358   GLUCOSE 107 (H) 07/01/2018 0345   GLUCOSE 106 (H) 06/30/2018 0358   CALCIUM 8.5 (L) 07/01/2018 0345   CALCIUM 8.5 (L) 06/30/2018 0358   AST 19 2020/03/1718 1558   AST 16 12/25/2015 0527   ALT 13 2020/03/1718 1558   ALT 15 12/25/2015 0527   ALKPHOS 73 2020/03/1718 1558   ALKPHOS 64 12/25/2015 0527   BILITOT 1.9 (H) 2020/03/1718 1558   BILITOT 0.7 12/25/2015 0527   PROT 8.0 2020/03/1718 1558   PROT 5.4 (L) 12/25/2015 3244  ALBUMIN 4.4 May 14, 202019 1558   ALBUMIN 2.8 (L) 12/25/2015 0527    Studies/Results: No results found.    Melinda [facility] M 07/02/2018  Patient ID: Melinda [facility], female   DOB: 1941-04-10, 77 y.o.   MRN: [id number]

## 2018-07-03 ENCOUNTER — Inpatient Hospital Stay (HOSPITAL_COMMUNITY): Payer: PPO

## 2018-07-03 NOTE — Progress Notes (Signed)
[facility] Progress Note     Subjective: CC-  Patient states that she feels horrible and cannot go home. Denies n/v. Tolerating few clear liquids but gets bloated when she tries to take in too much PO. No flatus, had a BM 2 days ago. Has not gotten OOB since admission. States that she has a h/o SBOs and it feels like she has one now. Afebrile.   Objective: Vital signs in last 24 hours: Temp:  [97.8 F (36.6 C)-98.1 F (36.7 C)] 98.1 F (36.7 C) (10/28 0525) Pulse Rate:  [64-73] 68 (10/28 0525) Resp:  [16-20] 16 (10/28 0525) BP: (141-185)/(68-124) 141/93 (10/28 0525) SpO2:  [98 %-100 %] 98 % (10/28 0525) Last BM Date: 07/02/18  Intake/Output from previous day: 10/27 0701 - 10/28 0700 In: 2051.5 [P.O.:340; I.V.:1711.5] Out: 3125 [Urine:3125] Intake/Output this shift: No intake/output data recorded.  PE: Gen:  Alert, NAD HEENT: EOM's intact, pupils equal and round Card:  RRR Pulm:  CTAB, no W/R/R, effort normal Abd: Soft, ND, +BS, no HSM, TTP RLQ with voluntary guarding Psych: A&Ox3  Skin: no rashes noted, warm and dry  Lab Results:  Recent Labs    07/01/18 0345  WBC 7.8  HGB 10.8*  HCT 35.8*  PLT 221   BMET Recent Labs    07/01/18 0345  NA 143  K 3.4*  CL 109  CO2 26  GLUCOSE 107*  BUN 7*  CREATININE 0.69  CALCIUM 8.5*   PT/INR No results for input(s): LABPROT, INR in the last 72 hours. CMP     Component Value Date/Time   NA 143 07/01/2018 0345   K 3.4 (L) 07/01/2018 0345   CL 109 07/01/2018 0345   CO2 26 07/01/2018 0345   GLUCOSE 107 (H) 07/01/2018 0345   BUN 7 (L) 07/01/2018 0345   CREATININE 0.69 07/01/2018 0345   CALCIUM 8.5 (L) 07/01/2018 0345   PROT 8.0 11/28/202019 1558   ALBUMIN 4.4 11/28/202019 1558   AST 19 11/28/202019 1558   ALT 13 11/28/202019 1558   ALKPHOS 73 11/28/202019 1558   BILITOT 1.9 (H) 11/28/202019 1558   GFRNONAA >60 07/01/2018 0345   GFRAA >60 07/01/2018 0345   Lipase     Component Value Date/Time   LIPASE  30 11/28/202019 1558       Studies/Results: No results found.  Anti-infectives: Anti-infectives (From admission, onward)   Start     Dose/Rate Route Frequency Ordered Stop   06/29/18 2000  meropenem (MERREM) 1 g in sodium chloride 0.9 % 100 mL IVPB  Status:  Discontinued     1 g 200 mL/hr over 30 Minutes Intravenous Every 8 hours 06/29/18 1356 06/29/18 1527   06/29/18 1800  cefTRIAXone (ROCEPHIN) 2 g in sodium chloride 0.9 % 100 mL IVPB     2 g 200 mL/hr over 30 Minutes Intravenous Every 24 hours 06/29/18 1527     06/29/18 1800  metroNIDAZOLE (FLAGYL) IVPB 500 mg     500 mg 100 mL/hr over 60 Minutes Intravenous Every 8 hours 06/29/18 1527     06/28/18 1000  meropenem (MERREM) 1 g in sodium chloride 0.9 % 100 mL IVPB  Status:  Discontinued     1 g 200 mL/hr over 30 Minutes Intravenous Every 12 hours 06/28/18 0554 06/29/18 1356   06/28/18 0030  meropenem (MERREM) 1 g in sodium chloride 0.9 % 100 mL IVPB     1 g 200 mL/hr over 30 Minutes Intravenous  Once 06/28/18 0027 06/28/18 0117  06/27/18 1945  aztreonam (AZACTAM) 2 g in sodium chloride 0.9 % 100 mL IVPB     2 g 200 mL/hr over 30 Minutes Intravenous  Once 06/27/18 1944 06/27/18 2053   06/27/18 1945  metroNIDAZOLE (FLAGYL) IVPB 500 mg     500 mg 100 mL/hr over 60 Minutes Intravenous  Once 06/27/18 1944 06/27/18 2158       Assessment/Plan Hypertension Migraine headaches Hx ofnon perforateddiverticulitis with exploratory lapartomy1993, hernia repair with Mesh 1993 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 RLQ abscess 0.9 x 4.3 centimeters - IR unable to drain b/c the abscesses are not well formed and are too small - WBC normalized, VSS  FEN: IVF, FLD ID: Meropenem 10/23 =>>day  6 DVT: SCD, lovenox Foley: none  Plan:Abdominal xray today to rule out SBO. Discussed importance of ambulating, needs to get out of bed at least TID. Will also ask PT to see patient. Diet as tolerated. Continue IV antibiotics.     LOS: 5 days    Franne Forts , [facility] 07/03/2018, 8:14 AM Pager: [phone] Mon 7:00 am -11:30 AM Tues-Fri 7:00 am-4:30 pm Sat-Sun 7:00 am-11:30 am

## 2018-07-03 NOTE — Evaluation (Signed)
Physical Therapy Evaluation Patient Details Name: Melinda Washington MRN: [id number] DOB: 02/23/1941 Today's Date: 07/03/2018   History of Present Illness  77 yo female admitted with perforated appendicitis. Hx of OA, SBO, migraines,  THA  Clinical Impression  Mod encouragement for participation. Pt tends to self limit activity progression. She had multiple reasons as to why she hadn't walked in the halls yet. Pt is very capable of ambulating-she just chooses not to. On eval, pt was supervision assist for mobility. She walked ~300 feet in the hallway. Encouraged pt to walk more often with nursing assistance, if needed. She denies need for HHPT and SNF.     Follow Up Recommendations No PT follow up(pt declines HHPT f/u)    Equipment Recommendations  None recommended by PT    Recommendations for Other Services       Precautions / Restrictions Precautions Precautions: Fall Restrictions Weight Bearing Restrictions: No      Mobility  Bed Mobility Overal bed mobility: Modified Independent                Transfers Overall transfer level: Modified independent                  Ambulation/Gait Ambulation/Gait assistance: Supervision Gait Distance (Feet): 300 Feet Assistive device: IV Pole(hallway handrail) Gait Pattern/deviations: Step-through pattern;Decreased stride length     General Gait Details: slow gait speed. Pt c/o some mild fatigue, dyspnea, lightheadedness.   Stairs            Wheelchair Mobility    Modified Rankin (Stroke Patients Only)       Balance Overall balance assessment: Mild deficits observed, not formally tested                                           Pertinent Vitals/Pain Pain Assessment: Faces Faces Pain Scale: Hurts little more Pain Location: R hip Pain Descriptors / Indicators: Aching;Sore Pain Intervention(s): Monitored during session    Home Living Family/patient expects to be discharged to::  Private residence Living Arrangements: Alone   Type of Home: House Home Access: Stairs to enter   Secretary/administrator of Steps: 1 Home Layout: Bed/bath upstairs Home Equipment: Cane - single point      Prior Function Level of Independence: Independent               Hand Dominance        Extremity/Trunk Assessment   Upper Extremity Assessment Upper Extremity Assessment: Overall WFL for tasks assessed    Lower Extremity Assessment Lower Extremity Assessment: Generalized weakness    Cervical / Trunk Assessment Cervical / Trunk Assessment: Normal  Communication   Communication: No difficulties  Cognition Arousal/Alertness: Awake/alert Behavior During Therapy: WFL for tasks assessed/performed Overall Cognitive Status: Within Functional Limits for tasks assessed                                        General Comments      Exercises     Assessment/Plan    PT Assessment Patient needs continued PT services  PT Problem List Decreased mobility       PT Treatment Interventions Gait training;Functional mobility training;Therapeutic activities;Patient/family education;Therapeutic exercise    PT Goals (Current goals can be found in the Care Plan section)  Acute Rehab PT  Goals Patient Stated Goal: to get better PT Goal Formulation: With patient Time For Goal Achievement: 07/17/18 Potential to Achieve Goals: Good    Frequency Min 3X/week   Barriers to discharge        Co-evaluation               AM-PAC PT "6 Clicks" Daily Activity  Outcome Measure Difficulty turning over in bed (including adjusting bedclothes, sheets and blankets)?: None Difficulty moving from lying on back to sitting on the side of the bed? : None Difficulty sitting down on and standing up from a chair with arms (e.g., wheelchair, bedside commode, etc,.)?: None Help needed moving to and from a bed to chair (including a wheelchair)?: None Help needed walking in  hospital room?: A Little Help needed climbing 3-5 steps with a railing? : A Little 6 Click Score: 22    End of Session   Activity Tolerance: Patient tolerated treatment well Patient left: in bed;with call bell/phone within reach   PT Visit Diagnosis: Difficulty in walking, not elsewhere classified (R26.2)    Time: 4098-1191 PT Time Calculation (min) (ACUTE ONLY): 40 min   Charges:   PT Evaluation $PT Eval Moderate Complexity: 1 Mod PT Treatments $Gait Training: 8-22 mins         Rebeca Alert, PT Acute Rehabilitation Services Pager: [phone] Office: [phone]

## 2018-07-03 NOTE — Care Management Important Message (Signed)
Important Message  Patient Details  Name: Melinda Washington MRN: [id number] Date of Birth: [birth year]   Medicare Important Message Given:  Yes    Caren Macadam 07/03/2018, 10:56 AMImportant Message  Patient Details  Name: Melinda Washington MRN: [id number] Date of Birth: June 09, 1941   Medicare Important Message Given:  Yes    Caren Macadam 07/03/2018, 10:56 AM

## 2018-07-04 ENCOUNTER — Encounter (HOSPITAL_COMMUNITY): Payer: Self-pay

## 2018-07-04 MED ORDER — METRONIDAZOLE 500 MG PO TABS
500.0000 mg | ORAL_TABLET | Freq: Four times a day (QID) | ORAL | Status: DC
  Administered 2018-07-04: 500 mg via ORAL
  Filled 2018-07-04: qty 1

## 2018-07-04 MED ORDER — CIPROFLOXACIN HCL 500 MG PO TABS: 500.0000 mg | ORAL_TABLET | Freq: Two times a day (BID) | ORAL | Status: DC

## 2018-07-04 MED ORDER — CEFDINIR 300 MG PO CAPS
300.0000 mg | ORAL_CAPSULE | Freq: Two times a day (BID) | ORAL | Status: DC
  Administered 2018-07-04 – 2018-07-05 (×3): 300 mg via ORAL
  Filled 2018-07-04 (×3): qty 1

## 2018-07-04 MED ORDER — AMOXICILLIN 500 MG PO CAPS: 500.0000 mg | ORAL_CAPSULE | Freq: Three times a day (TID) | ORAL | Status: DC

## 2018-07-04 NOTE — Progress Notes (Signed)
[facility] Office:  [phone] General Surgery Progress Note   LOS: 6 days  POD -     Chief Complaint: Abdominal pain  Assessment and Plan: 1.  Perforated appendix with abscess  - CT 10/22 showed appendiceal abscess measuring 2.8 x 2.8 centimeters, acuteinflammation of the appendix, no associated appendicolith.RLQabscess 0.9 x 4.3 centimeters  - IR unable to drain b/c the abscesses are not well formed and are too small  - WBC normalized, VSS  KUB yesterday okay   On Rocephin/Flagyl - 10/22 >>>  On liquid diet --> to advance to reg diet, change to oral antibiotics.  Home later today or tomorrow AM.  Discussed the need for colonoscopy.  2.  Hypertension 3.  Migraine headaches 4.  Hx ofnon perforateddiverticulitis with exploratory [phone], hernia repair with Mesh 1993 5.  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  DVT prophylaxis - Lovenox   Active Problems:   Acute appendicitis  Subjective:  Feels okay.  Not much bowel function, but chronically constipated secondary to the use of mag citrate tabs for headaches.   She sort of does her own thing and does not always follow med advice.  Objective:   Vitals:   07/04/18 0650 07/04/18 0900  BP: (!) 158/96 (!) 156/100  Pulse: 67 65  Resp: 18 18  Temp: 98.4 F (36.9 C) 98.6 F (37 C)  SpO2: 97% 99%     Intake/Output from previous day:  10/28 0701 - 10/29 0700 In: 7421.2 [P.O.:1320; I.V.:1755.3; IV Piggyback:4345.9] Out: 4250 [Urine:4250]  Intake/Output this shift:  Total I/O In: 476.8 [P.O.:120; I.V.:322.3; IV Piggyback:34.5] Out: 900 [Urine:900]   Physical Exam:   General: WN older WF who is alert and oriented.    HEENT: Normal. Pupils equal. .   Lungs: Clear.   Abdomen: Soft, no tenderness.  Has active BS.   Lab Results:   No results for input(s): WBC, HGB, HCT, PLT in the last 72 hours.  BMET  No results for input(s): NA, K,  CL, CO2, GLUCOSE, BUN, CREATININE, CALCIUM in the last 72 hours.  PT/INR  No results for input(s): LABPROT, INR in the last 72 hours.  ABG  No results for input(s): PHART, HCO3 in the last 72 hours.  Invalid input(s): PCO2, PO2   Studies/Results:  Dg Abd Portable 1v  Result Date: 07/03/2018 CLINICAL DATA:  77 year old female with a history of ileus. EXAM: PORTABLE ABDOMEN - 1 VIEW COMPARISON:  05-03-2018, plain film 12/27/2015 FINDINGS: Gas present throughout the length of the colon, with similar gas pattern to the comparison CT. Gas extends to the rectum. Gas within small bowel with no distention. Surgical changes of left hip arthroplasty. Advanced degenerative changes of the right hip. Degenerative changes of the spine. IMPRESSION: Negative for evidence of bowel obstruction. Similar gas pattern of the colon to the comparison CT, with gas extending to the rectum. Surgical changes of left hip arthroplasty, with advanced right hip degenerative changes Electronically Signed   By: Gilmer Mor D.O.   On: 07/03/2018 09:05     Anti-infectives:   Anti-infectives (From admission, onward)   Start     Dose/Rate Route Frequency Ordered Stop   06/29/18 2000  meropenem (MERREM) 1 g in sodium chloride 0.9 % 100 mL IVPB  Status:  Discontinued     1 g 200 mL/hr over 30 Minutes Intravenous Every 8 hours 06/29/18 1356 06/29/18 1527   06/29/18 1800  cefTRIAXone (ROCEPHIN) 2 g  in sodium chloride 0.9 % 100 mL IVPB     2 g 200 mL/hr over 30 Minutes Intravenous Every 24 hours 06/29/18 1527     06/29/18 1800  metroNIDAZOLE (FLAGYL) IVPB 500 mg     500 mg 100 mL/hr over 60 Minutes Intravenous Every 8 hours 06/29/18 1527     06/28/18 1000  meropenem (MERREM) 1 g in sodium chloride 0.9 % 100 mL IVPB  Status:  Discontinued     1 g 200 mL/hr over 30 Minutes Intravenous Every 12 hours 06/28/18 0554 06/29/18 1356   06/28/18 0030  meropenem (MERREM) 1 g in sodium chloride 0.9 % 100 mL IVPB     1 g 200 mL/hr over  30 Minutes Intravenous  Once 06/28/18 0027 06/28/18 0117   06/27/18 1945  aztreonam (AZACTAM) 2 g in sodium chloride 0.9 % 100 mL IVPB     2 g 200 mL/hr over 30 Minutes Intravenous  Once 06/27/18 1944 06/27/18 2053   06/27/18 1945  metroNIDAZOLE (FLAGYL) IVPB 500 mg     500 mg 100 mL/hr over 60 Minutes Intravenous  Once 06/27/18 1944 06/27/18 2158      Ovidio Kin, MD, FACS Pager: [phone] [facility] Office: [phone] 07/04/2018

## 2018-07-05 MED ORDER — CEFDINIR 300 MG PO CAPS: 300.0000 mg | ORAL_CAPSULE | Freq: Two times a day (BID) | ORAL | 0 refills | Status: AC

## 2018-07-05 MED ORDER — HYDROCODONE-ACETAMINOPHEN 5-325 MG PO TABS: 1.0000 | ORAL_TABLET | Freq: Four times a day (QID) | ORAL | 0 refills | Status: AC | PRN

## 2018-07-05 MED ORDER — ONDANSETRON 4 MG PO TBDP: 4.0000 mg | ORAL_TABLET | Freq: Four times a day (QID) | ORAL | 0 refills | Status: AC | PRN

## 2018-07-05 NOTE — Progress Notes (Signed)
Pt discharged to home via cab.  Pt verbalized understanding of discharge instructions and follow up care.  All belongings sent home with pt.  Education provided re: pain management, when to call MD and follow up care.  Sundra Aland, RN

## 2018-07-05 NOTE — Discharge Summary (Addendum)
[facility] Discharge Summary   Patient ID: Melinda Washington MRN: [id number] DOB/AGE: 1940/10/18 77 y.o.  Admit date: November 26, 202019 Discharge date: 07/05/2018  Admitting Diagnosis: Perforated appendicitis and abscess formation  Discharge Diagnosis Patient Active Problem List   Diagnosis Date Noted  . Acute appendicitis November 26, 202019  . Essential hypertension 12/19/2015  . Diverticulosis 12/19/2015  . SBO (small bowel obstruction) (HCC) 12/18/2015  . Overweight (BMI 25.0-29.9) 08/20/2015  . 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 Migraine 08/31/2012    Consultants Interventional radiology  Imaging: No results found.  Procedures None  Hospital Course:  Melinda Washington is a 77yo female who [facility] 10/22 with 2 days of abdominal pain, nausea, and vomiting.  Workup included CT scan which showed perforated appendicitis with abscess formation.  Patient was admitted to the surgical floor and started on IV zosyn. IR was consulted but was unable to place a percutaneous drain in pelvic abscess. Patient was treated nonoperatively with IV antibiotics. Leukocytosis resolved and abdominal pain improved therefore diet was advanced as tolerated. She was transitioned to oral antibiotics (cefdinir) 10/29. On 10/30 the patient was voiding well, tolerating diet, ambulating well, pain well controlled, vital signs stable and felt stable for discharge home.  Patient will go home with 10 more days of oral antibiotic. She will be referred to GI for a colonoscopy in 6-8 weeks. Patient will follow up as below and knows to call with questions or concerns.    I have personally reviewed the patients medication history on the [place] controlled substance database.    Physical Exam: Gen: Alert, NAD HEENT: EOM's intact, pupils equal and round Card: RRR Pulm: CTAB, no W/R/R, effort normal Abd: Soft,ND, +BS, no HSM,subjective RLQ TTP Psych: A&Ox3  Skin: no rashes noted, warm and  dry  Allergies as of 07/05/2018      Reactions   Sulfamethoxazole Rash   Ciprofloxacin    Pt says it makes her feeling "very weak."   Codeine Nausea And Vomiting   All pain medications cause some degree of nausea (moderate to severe)- able to tolerate when premedicated with antiemetic   Ketorolac Tromethamine Anxiety   Tremors, teeth chattering   Latex Rash   Redness & burning.   Penicillins Rash   Has patient had a PCN reaction causing immediate rash, facial/tongue/throat swelling, SOB or lightheadedness with hypotension: No Has patient had a PCN reaction causing severe rash involving mucus membranes or skin necrosis: No Has patient had a PCN reaction that required hospitalization No Has patient had a PCN reaction occurring within the last 10 years: No If all of the above answers are "NO", then may proceed with Cephalosporin use.      Medication List    TAKE these medications   cefdinir 300 MG capsule Commonly known as:  OMNICEF Take 1 capsule (300 mg total) by mouth every 12 (twelve) hours.   DIGESTIVE ENZYMES PO Take 1 capsule by mouth 2 (two) times daily with a meal.   HYDROcodone-acetaminophen 5-325 MG tablet Commonly known as:  NORCO/VICODIN Take 1 tablet by mouth every 6 (six) hours as needed for severe pain.   MAGNESIUM CITRATE PO Take 1,200 mg by mouth daily.   multivitamin with minerals Tabs tablet Take 1 tablet by mouth daily.   ondansetron 4 MG disintegrating tablet Commonly known as:  ZOFRAN-ODT Take 1 tablet (4 mg total) by mouth every 6 (six) hours as needed for nausea.   polyethylene glycol packet Commonly known as:  MIRALAX / GLYCOLAX  Take 17 g by mouth daily as needed (no BM for 24 hours).   PROBIOTIC DAILY PO Take 1 capsule by mouth daily.   SUMAtriptan 100 MG tablet Commonly known as:  IMITREX Take 1 tablet (100 mg total) by mouth every 2 (two) hours as needed for migraine. May take one tablet on the onset of headache. Then repeat in 2 hours  as needed. Maximum 2 doses in 24 hours   vitamin C 1000 MG tablet Take 4,000 mg by mouth 2 (two) times daily.   Vitamin D3 1000 units Caps Take 4,000 Units by mouth daily.   VITAMIN K2 PO Take 80 mcg by mouth daily.   Vitamin-B Complex Tabs Take 1 tablet by mouth daily.        Follow-up Information    [facility].   Why:  Someone from their office should contact you with an appointment Contact information: [address] [place] [address] [phone]        Andria Meuse, MD. Call.   Specialty:  General Surgery Why:  We are working on your appointment, please call to confirm. Please arrive 30 minutes prior to your appointment to check in. Contact information: [address] [place] [address] [phone]           Signed: Franne Forts, [facility] Surgery 07/05/2018, 11:35 AM Pager: [phone] Mon 7:00 am -11:30 AM Tues-Fri 7:00 am-4:30 pm Sat-Sun 7:00 am-11:30 am

## 2018-07-05 NOTE — Discharge Instructions (Signed)
Take antibiotics as prescribed Recommend eating small, frequent meals Stay hydrated and drink plenty of water Daily walking Call with concerns (ie fever, nausea/vomiting, increased abdominal pain...) We are working on your follow up appointment and our office will contact you with an appointment You will need to have a colonoscopy in 6-8 weeks. We are referring you to [facility], their office should contact you with an appointment

## 2018-10-09 DIAGNOSIS — K3532 Acute appendicitis with perforation and localized peritonitis, without abscess: Secondary | ICD-10-CM | POA: Diagnosis not present

## 2019-07-08 DEATH — deceased
